# Patient Record
Sex: Female | Born: 1940 | Race: White | Hispanic: No | Marital: Married | Smoking: Never smoker
Health system: Southern US, Community
[De-identification: ages and names within clinical notes are randomized; demographics above are authoritative.]

## PROBLEM LIST (undated history)

## (undated) DIAGNOSIS — R091 Pleurisy: Secondary | ICD-10-CM

## (undated) DIAGNOSIS — Z91018 Allergy to other foods: Secondary | ICD-10-CM

## (undated) DIAGNOSIS — M810 Age-related osteoporosis without current pathological fracture: Secondary | ICD-10-CM

## (undated) DIAGNOSIS — C50912 Malignant neoplasm of unspecified site of left female breast: Secondary | ICD-10-CM

## (undated) DIAGNOSIS — E063 Autoimmune thyroiditis: Secondary | ICD-10-CM

## (undated) DIAGNOSIS — I8390 Asymptomatic varicose veins of unspecified lower extremity: Secondary | ICD-10-CM

## (undated) DIAGNOSIS — M1990 Unspecified osteoarthritis, unspecified site: Secondary | ICD-10-CM

## (undated) DIAGNOSIS — M419 Scoliosis, unspecified: Secondary | ICD-10-CM

## (undated) DIAGNOSIS — Z8489 Family history of other specified conditions: Secondary | ICD-10-CM

## (undated) HISTORY — DX: Age-related osteoporosis without current pathological fracture: M81.0

## (undated) HISTORY — DX: Malignant neoplasm of unspecified site of left female breast: C50.912

## (undated) HISTORY — PX: SIGMOIDOSCOPY: SUR1295

---
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
## 1998-05-09 ENCOUNTER — Ambulatory Visit (HOSPITAL_COMMUNITY): Admission: RE | Admit: 1998-05-09 | Discharge: 1998-05-09 | Payer: Self-pay | Admitting: Family Medicine

## 1998-06-15 ENCOUNTER — Ambulatory Visit (HOSPITAL_BASED_OUTPATIENT_CLINIC_OR_DEPARTMENT_OTHER): Admission: RE | Admit: 1998-06-15 | Discharge: 1998-06-15 | Payer: Self-pay | Admitting: Ophthalmology

## 2000-03-06 ENCOUNTER — Other Ambulatory Visit: Admission: RE | Admit: 2000-03-06 | Discharge: 2000-03-06 | Payer: Self-pay | Admitting: Family Medicine

## 2000-06-05 ENCOUNTER — Other Ambulatory Visit: Admission: RE | Admit: 2000-06-05 | Discharge: 2000-06-05 | Payer: Self-pay | Admitting: Family Medicine

## 2000-10-23 ENCOUNTER — Other Ambulatory Visit: Admission: RE | Admit: 2000-10-23 | Discharge: 2000-10-23 | Payer: Self-pay | Admitting: Family Medicine

## 2000-10-23 ENCOUNTER — Encounter: Payer: Self-pay | Admitting: Family Medicine

## 2000-10-23 ENCOUNTER — Encounter: Admission: RE | Admit: 2000-10-23 | Discharge: 2000-10-23 | Payer: Self-pay | Admitting: Family Medicine

## 2011-11-02 DIAGNOSIS — M9981 Other biomechanical lesions of cervical region: Secondary | ICD-10-CM | POA: Diagnosis not present

## 2011-11-02 DIAGNOSIS — M542 Cervicalgia: Secondary | ICD-10-CM | POA: Diagnosis not present

## 2011-11-14 DIAGNOSIS — M9981 Other biomechanical lesions of cervical region: Secondary | ICD-10-CM | POA: Diagnosis not present

## 2011-11-14 DIAGNOSIS — M542 Cervicalgia: Secondary | ICD-10-CM | POA: Diagnosis not present

## 2011-11-29 DIAGNOSIS — M9981 Other biomechanical lesions of cervical region: Secondary | ICD-10-CM | POA: Diagnosis not present

## 2011-11-29 DIAGNOSIS — M542 Cervicalgia: Secondary | ICD-10-CM | POA: Diagnosis not present

## 2011-12-05 DIAGNOSIS — M542 Cervicalgia: Secondary | ICD-10-CM | POA: Diagnosis not present

## 2011-12-05 DIAGNOSIS — M9981 Other biomechanical lesions of cervical region: Secondary | ICD-10-CM | POA: Diagnosis not present

## 2011-12-20 DIAGNOSIS — M9981 Other biomechanical lesions of cervical region: Secondary | ICD-10-CM | POA: Diagnosis not present

## 2011-12-20 DIAGNOSIS — M542 Cervicalgia: Secondary | ICD-10-CM | POA: Diagnosis not present

## 2011-12-28 DIAGNOSIS — M9981 Other biomechanical lesions of cervical region: Secondary | ICD-10-CM | POA: Diagnosis not present

## 2011-12-28 DIAGNOSIS — M542 Cervicalgia: Secondary | ICD-10-CM | POA: Diagnosis not present

## 2012-01-11 DIAGNOSIS — M542 Cervicalgia: Secondary | ICD-10-CM | POA: Diagnosis not present

## 2012-01-11 DIAGNOSIS — M9981 Other biomechanical lesions of cervical region: Secondary | ICD-10-CM | POA: Diagnosis not present

## 2012-01-24 DIAGNOSIS — M9981 Other biomechanical lesions of cervical region: Secondary | ICD-10-CM | POA: Diagnosis not present

## 2012-01-24 DIAGNOSIS — M542 Cervicalgia: Secondary | ICD-10-CM | POA: Diagnosis not present

## 2012-02-08 DIAGNOSIS — M9981 Other biomechanical lesions of cervical region: Secondary | ICD-10-CM | POA: Diagnosis not present

## 2012-02-08 DIAGNOSIS — M542 Cervicalgia: Secondary | ICD-10-CM | POA: Diagnosis not present

## 2012-02-18 DIAGNOSIS — M542 Cervicalgia: Secondary | ICD-10-CM | POA: Diagnosis not present

## 2012-02-18 DIAGNOSIS — M9981 Other biomechanical lesions of cervical region: Secondary | ICD-10-CM | POA: Diagnosis not present

## 2012-02-29 DIAGNOSIS — M9981 Other biomechanical lesions of cervical region: Secondary | ICD-10-CM | POA: Diagnosis not present

## 2012-02-29 DIAGNOSIS — M542 Cervicalgia: Secondary | ICD-10-CM | POA: Diagnosis not present

## 2012-03-20 DIAGNOSIS — M9981 Other biomechanical lesions of cervical region: Secondary | ICD-10-CM | POA: Diagnosis not present

## 2012-03-20 DIAGNOSIS — M542 Cervicalgia: Secondary | ICD-10-CM | POA: Diagnosis not present

## 2012-04-09 DIAGNOSIS — M9981 Other biomechanical lesions of cervical region: Secondary | ICD-10-CM | POA: Diagnosis not present

## 2012-04-09 DIAGNOSIS — M542 Cervicalgia: Secondary | ICD-10-CM | POA: Diagnosis not present

## 2012-04-23 DIAGNOSIS — M9981 Other biomechanical lesions of cervical region: Secondary | ICD-10-CM | POA: Diagnosis not present

## 2012-04-23 DIAGNOSIS — M542 Cervicalgia: Secondary | ICD-10-CM | POA: Diagnosis not present

## 2012-04-29 DIAGNOSIS — H2510 Age-related nuclear cataract, unspecified eye: Secondary | ICD-10-CM | POA: Diagnosis not present

## 2012-04-29 DIAGNOSIS — H5231 Anisometropia: Secondary | ICD-10-CM | POA: Diagnosis not present

## 2012-04-29 DIAGNOSIS — H52229 Regular astigmatism, unspecified eye: Secondary | ICD-10-CM | POA: Diagnosis not present

## 2012-04-29 DIAGNOSIS — H524 Presbyopia: Secondary | ICD-10-CM | POA: Diagnosis not present

## 2012-05-15 DIAGNOSIS — M9981 Other biomechanical lesions of cervical region: Secondary | ICD-10-CM | POA: Diagnosis not present

## 2012-05-15 DIAGNOSIS — M542 Cervicalgia: Secondary | ICD-10-CM | POA: Diagnosis not present

## 2012-05-28 DIAGNOSIS — M542 Cervicalgia: Secondary | ICD-10-CM | POA: Diagnosis not present

## 2012-05-28 DIAGNOSIS — M9981 Other biomechanical lesions of cervical region: Secondary | ICD-10-CM | POA: Diagnosis not present

## 2012-06-12 DIAGNOSIS — M542 Cervicalgia: Secondary | ICD-10-CM | POA: Diagnosis not present

## 2012-06-12 DIAGNOSIS — M9981 Other biomechanical lesions of cervical region: Secondary | ICD-10-CM | POA: Diagnosis not present

## 2012-06-16 DIAGNOSIS — R6889 Other general symptoms and signs: Secondary | ICD-10-CM | POA: Diagnosis not present

## 2012-06-16 DIAGNOSIS — Z13220 Encounter for screening for lipoid disorders: Secondary | ICD-10-CM | POA: Diagnosis not present

## 2012-06-16 DIAGNOSIS — Z0000 Encounter for general adult medical examination without abnormal findings: Secondary | ICD-10-CM | POA: Diagnosis not present

## 2012-06-16 DIAGNOSIS — E039 Hypothyroidism, unspecified: Secondary | ICD-10-CM | POA: Diagnosis not present

## 2012-06-18 DIAGNOSIS — Z0000 Encounter for general adult medical examination without abnormal findings: Secondary | ICD-10-CM | POA: Diagnosis not present

## 2012-06-18 DIAGNOSIS — E039 Hypothyroidism, unspecified: Secondary | ICD-10-CM | POA: Diagnosis not present

## 2012-06-19 DIAGNOSIS — Z1212 Encounter for screening for malignant neoplasm of rectum: Secondary | ICD-10-CM | POA: Diagnosis not present

## 2012-06-26 DIAGNOSIS — M9981 Other biomechanical lesions of cervical region: Secondary | ICD-10-CM | POA: Diagnosis not present

## 2012-06-26 DIAGNOSIS — M542 Cervicalgia: Secondary | ICD-10-CM | POA: Diagnosis not present

## 2012-07-01 DIAGNOSIS — M810 Age-related osteoporosis without current pathological fracture: Secondary | ICD-10-CM | POA: Diagnosis not present

## 2012-07-01 DIAGNOSIS — Z13820 Encounter for screening for osteoporosis: Secondary | ICD-10-CM | POA: Diagnosis not present

## 2012-07-01 DIAGNOSIS — N959 Unspecified menopausal and perimenopausal disorder: Secondary | ICD-10-CM | POA: Diagnosis not present

## 2012-07-02 DIAGNOSIS — M542 Cervicalgia: Secondary | ICD-10-CM | POA: Diagnosis not present

## 2012-07-02 DIAGNOSIS — M9981 Other biomechanical lesions of cervical region: Secondary | ICD-10-CM | POA: Diagnosis not present

## 2012-07-16 DIAGNOSIS — M9981 Other biomechanical lesions of cervical region: Secondary | ICD-10-CM | POA: Diagnosis not present

## 2012-07-16 DIAGNOSIS — M542 Cervicalgia: Secondary | ICD-10-CM | POA: Diagnosis not present

## 2012-08-01 DIAGNOSIS — M542 Cervicalgia: Secondary | ICD-10-CM | POA: Diagnosis not present

## 2012-08-01 DIAGNOSIS — M9981 Other biomechanical lesions of cervical region: Secondary | ICD-10-CM | POA: Diagnosis not present

## 2012-08-14 DIAGNOSIS — M9981 Other biomechanical lesions of cervical region: Secondary | ICD-10-CM | POA: Diagnosis not present

## 2012-08-14 DIAGNOSIS — M542 Cervicalgia: Secondary | ICD-10-CM | POA: Diagnosis not present

## 2012-08-29 DIAGNOSIS — M9981 Other biomechanical lesions of cervical region: Secondary | ICD-10-CM | POA: Diagnosis not present

## 2012-08-29 DIAGNOSIS — M542 Cervicalgia: Secondary | ICD-10-CM | POA: Diagnosis not present

## 2012-09-12 DIAGNOSIS — M542 Cervicalgia: Secondary | ICD-10-CM | POA: Diagnosis not present

## 2012-09-12 DIAGNOSIS — M9981 Other biomechanical lesions of cervical region: Secondary | ICD-10-CM | POA: Diagnosis not present

## 2012-09-23 DIAGNOSIS — M542 Cervicalgia: Secondary | ICD-10-CM | POA: Diagnosis not present

## 2012-09-23 DIAGNOSIS — M9981 Other biomechanical lesions of cervical region: Secondary | ICD-10-CM | POA: Diagnosis not present

## 2012-10-03 DIAGNOSIS — M542 Cervicalgia: Secondary | ICD-10-CM | POA: Diagnosis not present

## 2012-10-03 DIAGNOSIS — M9981 Other biomechanical lesions of cervical region: Secondary | ICD-10-CM | POA: Diagnosis not present

## 2012-10-21 DIAGNOSIS — M9981 Other biomechanical lesions of cervical region: Secondary | ICD-10-CM | POA: Diagnosis not present

## 2012-10-21 DIAGNOSIS — M542 Cervicalgia: Secondary | ICD-10-CM | POA: Diagnosis not present

## 2012-11-07 DIAGNOSIS — M9981 Other biomechanical lesions of cervical region: Secondary | ICD-10-CM | POA: Diagnosis not present

## 2012-11-07 DIAGNOSIS — M542 Cervicalgia: Secondary | ICD-10-CM | POA: Diagnosis not present

## 2012-11-24 DIAGNOSIS — M9981 Other biomechanical lesions of cervical region: Secondary | ICD-10-CM | POA: Diagnosis not present

## 2012-11-24 DIAGNOSIS — M542 Cervicalgia: Secondary | ICD-10-CM | POA: Diagnosis not present

## 2012-12-08 DIAGNOSIS — M542 Cervicalgia: Secondary | ICD-10-CM | POA: Diagnosis not present

## 2012-12-08 DIAGNOSIS — M9981 Other biomechanical lesions of cervical region: Secondary | ICD-10-CM | POA: Diagnosis not present

## 2012-12-09 DIAGNOSIS — R0602 Shortness of breath: Secondary | ICD-10-CM | POA: Diagnosis not present

## 2012-12-09 DIAGNOSIS — E039 Hypothyroidism, unspecified: Secondary | ICD-10-CM | POA: Diagnosis not present

## 2012-12-16 DIAGNOSIS — E279 Disorder of adrenal gland, unspecified: Secondary | ICD-10-CM | POA: Diagnosis not present

## 2012-12-16 DIAGNOSIS — R6812 Fussy infant (baby): Secondary | ICD-10-CM | POA: Diagnosis not present

## 2012-12-17 DIAGNOSIS — E039 Hypothyroidism, unspecified: Secondary | ICD-10-CM | POA: Diagnosis not present

## 2012-12-17 DIAGNOSIS — E559 Vitamin D deficiency, unspecified: Secondary | ICD-10-CM | POA: Diagnosis not present

## 2012-12-17 DIAGNOSIS — R7989 Other specified abnormal findings of blood chemistry: Secondary | ICD-10-CM | POA: Diagnosis not present

## 2012-12-17 DIAGNOSIS — D509 Iron deficiency anemia, unspecified: Secondary | ICD-10-CM | POA: Diagnosis not present

## 2012-12-17 DIAGNOSIS — R5381 Other malaise: Secondary | ICD-10-CM | POA: Diagnosis not present

## 2012-12-17 DIAGNOSIS — R0602 Shortness of breath: Secondary | ICD-10-CM | POA: Diagnosis not present

## 2012-12-19 DIAGNOSIS — M542 Cervicalgia: Secondary | ICD-10-CM | POA: Diagnosis not present

## 2012-12-19 DIAGNOSIS — M9981 Other biomechanical lesions of cervical region: Secondary | ICD-10-CM | POA: Diagnosis not present

## 2012-12-22 DIAGNOSIS — M542 Cervicalgia: Secondary | ICD-10-CM | POA: Diagnosis not present

## 2012-12-22 DIAGNOSIS — M9981 Other biomechanical lesions of cervical region: Secondary | ICD-10-CM | POA: Diagnosis not present

## 2013-01-09 DIAGNOSIS — M9981 Other biomechanical lesions of cervical region: Secondary | ICD-10-CM | POA: Diagnosis not present

## 2013-01-09 DIAGNOSIS — M542 Cervicalgia: Secondary | ICD-10-CM | POA: Diagnosis not present

## 2013-01-12 DIAGNOSIS — E039 Hypothyroidism, unspecified: Secondary | ICD-10-CM | POA: Diagnosis not present

## 2013-02-07 DIAGNOSIS — E039 Hypothyroidism, unspecified: Secondary | ICD-10-CM | POA: Diagnosis not present

## 2013-02-24 DIAGNOSIS — E039 Hypothyroidism, unspecified: Secondary | ICD-10-CM | POA: Diagnosis not present

## 2013-04-09 DIAGNOSIS — E039 Hypothyroidism, unspecified: Secondary | ICD-10-CM | POA: Diagnosis not present

## 2013-05-12 DIAGNOSIS — E559 Vitamin D deficiency, unspecified: Secondary | ICD-10-CM | POA: Diagnosis not present

## 2013-05-12 DIAGNOSIS — D509 Iron deficiency anemia, unspecified: Secondary | ICD-10-CM | POA: Diagnosis not present

## 2013-05-12 DIAGNOSIS — R7989 Other specified abnormal findings of blood chemistry: Secondary | ICD-10-CM | POA: Diagnosis not present

## 2013-05-12 DIAGNOSIS — E039 Hypothyroidism, unspecified: Secondary | ICD-10-CM | POA: Diagnosis not present

## 2013-05-12 DIAGNOSIS — R5383 Other fatigue: Secondary | ICD-10-CM | POA: Diagnosis not present

## 2013-05-19 DIAGNOSIS — E039 Hypothyroidism, unspecified: Secondary | ICD-10-CM | POA: Diagnosis not present

## 2013-06-04 DIAGNOSIS — L719 Rosacea, unspecified: Secondary | ICD-10-CM | POA: Diagnosis not present

## 2013-06-04 DIAGNOSIS — J309 Allergic rhinitis, unspecified: Secondary | ICD-10-CM | POA: Diagnosis not present

## 2013-06-04 DIAGNOSIS — T7840XA Allergy, unspecified, initial encounter: Secondary | ICD-10-CM | POA: Diagnosis not present

## 2013-06-04 DIAGNOSIS — E039 Hypothyroidism, unspecified: Secondary | ICD-10-CM | POA: Diagnosis not present

## 2013-06-30 DIAGNOSIS — E039 Hypothyroidism, unspecified: Secondary | ICD-10-CM | POA: Diagnosis not present

## 2013-09-03 DIAGNOSIS — Z6833 Body mass index (BMI) 33.0-33.9, adult: Secondary | ICD-10-CM | POA: Diagnosis not present

## 2013-09-03 DIAGNOSIS — E669 Obesity, unspecified: Secondary | ICD-10-CM | POA: Diagnosis not present

## 2013-09-03 DIAGNOSIS — Q828 Other specified congenital malformations of skin: Secondary | ICD-10-CM | POA: Diagnosis not present

## 2013-09-07 DIAGNOSIS — R5381 Other malaise: Secondary | ICD-10-CM | POA: Diagnosis not present

## 2013-09-07 DIAGNOSIS — R7989 Other specified abnormal findings of blood chemistry: Secondary | ICD-10-CM | POA: Diagnosis not present

## 2013-09-07 DIAGNOSIS — E039 Hypothyroidism, unspecified: Secondary | ICD-10-CM | POA: Diagnosis not present

## 2013-09-08 DIAGNOSIS — E039 Hypothyroidism, unspecified: Secondary | ICD-10-CM | POA: Diagnosis not present

## 2013-09-08 DIAGNOSIS — Z124 Encounter for screening for malignant neoplasm of cervix: Secondary | ICD-10-CM | POA: Diagnosis not present

## 2013-09-08 DIAGNOSIS — E663 Overweight: Secondary | ICD-10-CM | POA: Diagnosis not present

## 2013-09-08 DIAGNOSIS — Z1212 Encounter for screening for malignant neoplasm of rectum: Secondary | ICD-10-CM | POA: Diagnosis not present

## 2013-09-08 DIAGNOSIS — Z0000 Encounter for general adult medical examination without abnormal findings: Secondary | ICD-10-CM | POA: Diagnosis not present

## 2013-09-08 DIAGNOSIS — R87615 Unsatisfactory cytologic smear of cervix: Secondary | ICD-10-CM | POA: Diagnosis not present

## 2013-09-08 DIAGNOSIS — IMO0002 Reserved for concepts with insufficient information to code with codable children: Secondary | ICD-10-CM | POA: Diagnosis not present

## 2013-09-08 DIAGNOSIS — Z01419 Encounter for gynecological examination (general) (routine) without abnormal findings: Secondary | ICD-10-CM | POA: Diagnosis not present

## 2013-09-09 DIAGNOSIS — IMO0002 Reserved for concepts with insufficient information to code with codable children: Secondary | ICD-10-CM | POA: Diagnosis not present

## 2013-09-09 DIAGNOSIS — Z01419 Encounter for gynecological examination (general) (routine) without abnormal findings: Secondary | ICD-10-CM | POA: Diagnosis not present

## 2013-09-09 DIAGNOSIS — E039 Hypothyroidism, unspecified: Secondary | ICD-10-CM | POA: Diagnosis not present

## 2013-09-09 DIAGNOSIS — Z1212 Encounter for screening for malignant neoplasm of rectum: Secondary | ICD-10-CM | POA: Diagnosis not present

## 2013-09-09 DIAGNOSIS — E663 Overweight: Secondary | ICD-10-CM | POA: Diagnosis not present

## 2013-09-09 DIAGNOSIS — Z0000 Encounter for general adult medical examination without abnormal findings: Secondary | ICD-10-CM | POA: Diagnosis not present

## 2013-09-09 DIAGNOSIS — Z124 Encounter for screening for malignant neoplasm of cervix: Secondary | ICD-10-CM | POA: Diagnosis not present

## 2013-09-16 DIAGNOSIS — H2510 Age-related nuclear cataract, unspecified eye: Secondary | ICD-10-CM | POA: Diagnosis not present

## 2013-09-21 DIAGNOSIS — E039 Hypothyroidism, unspecified: Secondary | ICD-10-CM | POA: Diagnosis not present

## 2013-11-13 DIAGNOSIS — L719 Rosacea, unspecified: Secondary | ICD-10-CM | POA: Diagnosis not present

## 2013-11-13 DIAGNOSIS — L819 Disorder of pigmentation, unspecified: Secondary | ICD-10-CM | POA: Diagnosis not present

## 2013-11-13 DIAGNOSIS — L821 Other seborrheic keratosis: Secondary | ICD-10-CM | POA: Diagnosis not present

## 2013-11-23 DIAGNOSIS — E039 Hypothyroidism, unspecified: Secondary | ICD-10-CM | POA: Diagnosis not present

## 2013-12-28 DIAGNOSIS — M542 Cervicalgia: Secondary | ICD-10-CM | POA: Diagnosis not present

## 2013-12-28 DIAGNOSIS — M9981 Other biomechanical lesions of cervical region: Secondary | ICD-10-CM | POA: Diagnosis not present

## 2013-12-29 DIAGNOSIS — R5381 Other malaise: Secondary | ICD-10-CM | POA: Diagnosis not present

## 2013-12-29 DIAGNOSIS — R7989 Other specified abnormal findings of blood chemistry: Secondary | ICD-10-CM | POA: Diagnosis not present

## 2013-12-29 DIAGNOSIS — E039 Hypothyroidism, unspecified: Secondary | ICD-10-CM | POA: Diagnosis not present

## 2013-12-29 DIAGNOSIS — R5383 Other fatigue: Secondary | ICD-10-CM | POA: Diagnosis not present

## 2013-12-29 DIAGNOSIS — E559 Vitamin D deficiency, unspecified: Secondary | ICD-10-CM | POA: Diagnosis not present

## 2014-01-12 DIAGNOSIS — E039 Hypothyroidism, unspecified: Secondary | ICD-10-CM | POA: Diagnosis not present

## 2014-01-27 DIAGNOSIS — M9981 Other biomechanical lesions of cervical region: Secondary | ICD-10-CM | POA: Diagnosis not present

## 2014-01-27 DIAGNOSIS — M542 Cervicalgia: Secondary | ICD-10-CM | POA: Diagnosis not present

## 2014-02-08 DIAGNOSIS — M542 Cervicalgia: Secondary | ICD-10-CM | POA: Diagnosis not present

## 2014-02-08 DIAGNOSIS — M9981 Other biomechanical lesions of cervical region: Secondary | ICD-10-CM | POA: Diagnosis not present

## 2014-02-10 DIAGNOSIS — M9981 Other biomechanical lesions of cervical region: Secondary | ICD-10-CM | POA: Diagnosis not present

## 2014-02-10 DIAGNOSIS — M542 Cervicalgia: Secondary | ICD-10-CM | POA: Diagnosis not present

## 2014-02-25 DIAGNOSIS — M542 Cervicalgia: Secondary | ICD-10-CM | POA: Diagnosis not present

## 2014-02-25 DIAGNOSIS — M9981 Other biomechanical lesions of cervical region: Secondary | ICD-10-CM | POA: Diagnosis not present

## 2014-03-18 DIAGNOSIS — M542 Cervicalgia: Secondary | ICD-10-CM | POA: Diagnosis not present

## 2014-03-18 DIAGNOSIS — M9981 Other biomechanical lesions of cervical region: Secondary | ICD-10-CM | POA: Diagnosis not present

## 2014-04-08 DIAGNOSIS — M542 Cervicalgia: Secondary | ICD-10-CM | POA: Diagnosis not present

## 2014-04-08 DIAGNOSIS — M9981 Other biomechanical lesions of cervical region: Secondary | ICD-10-CM | POA: Diagnosis not present

## 2014-04-28 DIAGNOSIS — M542 Cervicalgia: Secondary | ICD-10-CM | POA: Diagnosis not present

## 2014-04-28 DIAGNOSIS — M9981 Other biomechanical lesions of cervical region: Secondary | ICD-10-CM | POA: Diagnosis not present

## 2014-05-19 DIAGNOSIS — M542 Cervicalgia: Secondary | ICD-10-CM | POA: Diagnosis not present

## 2014-05-19 DIAGNOSIS — M9981 Other biomechanical lesions of cervical region: Secondary | ICD-10-CM | POA: Diagnosis not present

## 2014-05-31 DIAGNOSIS — M9981 Other biomechanical lesions of cervical region: Secondary | ICD-10-CM | POA: Diagnosis not present

## 2014-05-31 DIAGNOSIS — M542 Cervicalgia: Secondary | ICD-10-CM | POA: Diagnosis not present

## 2014-06-24 DIAGNOSIS — M9981 Other biomechanical lesions of cervical region: Secondary | ICD-10-CM | POA: Diagnosis not present

## 2014-06-24 DIAGNOSIS — M542 Cervicalgia: Secondary | ICD-10-CM | POA: Diagnosis not present

## 2014-06-29 DIAGNOSIS — R7989 Other specified abnormal findings of blood chemistry: Secondary | ICD-10-CM | POA: Diagnosis not present

## 2014-06-29 DIAGNOSIS — E039 Hypothyroidism, unspecified: Secondary | ICD-10-CM | POA: Diagnosis not present

## 2014-06-29 DIAGNOSIS — E559 Vitamin D deficiency, unspecified: Secondary | ICD-10-CM | POA: Diagnosis not present

## 2014-07-06 DIAGNOSIS — M9981 Other biomechanical lesions of cervical region: Secondary | ICD-10-CM | POA: Diagnosis not present

## 2014-07-06 DIAGNOSIS — M542 Cervicalgia: Secondary | ICD-10-CM | POA: Diagnosis not present

## 2014-07-13 DIAGNOSIS — E039 Hypothyroidism, unspecified: Secondary | ICD-10-CM | POA: Diagnosis not present

## 2014-07-20 DIAGNOSIS — M542 Cervicalgia: Secondary | ICD-10-CM | POA: Diagnosis not present

## 2014-07-20 DIAGNOSIS — M9981 Other biomechanical lesions of cervical region: Secondary | ICD-10-CM | POA: Diagnosis not present

## 2014-07-29 DIAGNOSIS — M542 Cervicalgia: Secondary | ICD-10-CM | POA: Diagnosis not present

## 2014-07-29 DIAGNOSIS — M9901 Segmental and somatic dysfunction of cervical region: Secondary | ICD-10-CM | POA: Diagnosis not present

## 2014-09-09 DIAGNOSIS — M8580 Other specified disorders of bone density and structure, unspecified site: Secondary | ICD-10-CM | POA: Diagnosis not present

## 2014-09-09 DIAGNOSIS — Z0001 Encounter for general adult medical examination with abnormal findings: Secondary | ICD-10-CM | POA: Diagnosis not present

## 2014-09-09 DIAGNOSIS — Z136 Encounter for screening for cardiovascular disorders: Secondary | ICD-10-CM | POA: Diagnosis not present

## 2014-09-09 DIAGNOSIS — Z131 Encounter for screening for diabetes mellitus: Secondary | ICD-10-CM | POA: Diagnosis not present

## 2014-09-09 DIAGNOSIS — Z13220 Encounter for screening for lipoid disorders: Secondary | ICD-10-CM | POA: Diagnosis not present

## 2014-09-09 DIAGNOSIS — N63 Unspecified lump in breast: Secondary | ICD-10-CM | POA: Diagnosis not present

## 2014-09-09 DIAGNOSIS — Z1389 Encounter for screening for other disorder: Secondary | ICD-10-CM | POA: Diagnosis not present

## 2014-09-30 DIAGNOSIS — R7989 Other specified abnormal findings of blood chemistry: Secondary | ICD-10-CM | POA: Diagnosis not present

## 2014-09-30 DIAGNOSIS — E559 Vitamin D deficiency, unspecified: Secondary | ICD-10-CM | POA: Diagnosis not present

## 2014-09-30 DIAGNOSIS — E039 Hypothyroidism, unspecified: Secondary | ICD-10-CM | POA: Diagnosis not present

## 2014-09-30 DIAGNOSIS — C50919 Malignant neoplasm of unspecified site of unspecified female breast: Secondary | ICD-10-CM | POA: Diagnosis not present

## 2014-09-30 DIAGNOSIS — N6002 Solitary cyst of left breast: Secondary | ICD-10-CM | POA: Diagnosis not present

## 2014-10-06 DIAGNOSIS — M810 Age-related osteoporosis without current pathological fracture: Secondary | ICD-10-CM | POA: Diagnosis not present

## 2014-10-06 DIAGNOSIS — H2513 Age-related nuclear cataract, bilateral: Secondary | ICD-10-CM | POA: Diagnosis not present

## 2014-10-14 DIAGNOSIS — E039 Hypothyroidism, unspecified: Secondary | ICD-10-CM | POA: Diagnosis not present

## 2014-10-14 DIAGNOSIS — N6002 Solitary cyst of left breast: Secondary | ICD-10-CM | POA: Diagnosis not present

## 2014-11-04 DIAGNOSIS — M542 Cervicalgia: Secondary | ICD-10-CM | POA: Diagnosis not present

## 2014-11-04 DIAGNOSIS — M9901 Segmental and somatic dysfunction of cervical region: Secondary | ICD-10-CM | POA: Diagnosis not present

## 2014-11-16 DIAGNOSIS — M542 Cervicalgia: Secondary | ICD-10-CM | POA: Diagnosis not present

## 2014-11-16 DIAGNOSIS — M9901 Segmental and somatic dysfunction of cervical region: Secondary | ICD-10-CM | POA: Diagnosis not present

## 2014-12-09 DIAGNOSIS — M9901 Segmental and somatic dysfunction of cervical region: Secondary | ICD-10-CM | POA: Diagnosis not present

## 2014-12-09 DIAGNOSIS — M542 Cervicalgia: Secondary | ICD-10-CM | POA: Diagnosis not present

## 2014-12-20 DIAGNOSIS — N63 Unspecified lump in breast: Secondary | ICD-10-CM | POA: Diagnosis not present

## 2015-01-03 DIAGNOSIS — M542 Cervicalgia: Secondary | ICD-10-CM | POA: Diagnosis not present

## 2015-01-03 DIAGNOSIS — M9901 Segmental and somatic dysfunction of cervical region: Secondary | ICD-10-CM | POA: Diagnosis not present

## 2015-01-18 DIAGNOSIS — N63 Unspecified lump in breast: Secondary | ICD-10-CM | POA: Diagnosis not present

## 2015-01-20 DIAGNOSIS — M542 Cervicalgia: Secondary | ICD-10-CM | POA: Diagnosis not present

## 2015-01-20 DIAGNOSIS — M9901 Segmental and somatic dysfunction of cervical region: Secondary | ICD-10-CM | POA: Diagnosis not present

## 2015-01-27 DIAGNOSIS — M542 Cervicalgia: Secondary | ICD-10-CM | POA: Diagnosis not present

## 2015-01-27 DIAGNOSIS — M9901 Segmental and somatic dysfunction of cervical region: Secondary | ICD-10-CM | POA: Diagnosis not present

## 2015-02-15 DIAGNOSIS — M9901 Segmental and somatic dysfunction of cervical region: Secondary | ICD-10-CM | POA: Diagnosis not present

## 2015-02-15 DIAGNOSIS — M542 Cervicalgia: Secondary | ICD-10-CM | POA: Diagnosis not present

## 2015-02-22 DIAGNOSIS — E069 Thyroiditis, unspecified: Secondary | ICD-10-CM | POA: Diagnosis not present

## 2015-02-22 DIAGNOSIS — E559 Vitamin D deficiency, unspecified: Secondary | ICD-10-CM | POA: Diagnosis not present

## 2015-02-22 DIAGNOSIS — R7989 Other specified abnormal findings of blood chemistry: Secondary | ICD-10-CM | POA: Diagnosis not present

## 2015-02-22 DIAGNOSIS — E039 Hypothyroidism, unspecified: Secondary | ICD-10-CM | POA: Diagnosis not present

## 2015-02-27 DIAGNOSIS — C50912 Malignant neoplasm of unspecified site of left female breast: Secondary | ICD-10-CM

## 2015-02-27 HISTORY — DX: Malignant neoplasm of unspecified site of left female breast: C50.912

## 2015-03-03 DIAGNOSIS — M9901 Segmental and somatic dysfunction of cervical region: Secondary | ICD-10-CM | POA: Diagnosis not present

## 2015-03-03 DIAGNOSIS — M542 Cervicalgia: Secondary | ICD-10-CM | POA: Diagnosis not present

## 2015-03-17 DIAGNOSIS — M542 Cervicalgia: Secondary | ICD-10-CM | POA: Diagnosis not present

## 2015-03-17 DIAGNOSIS — M9901 Segmental and somatic dysfunction of cervical region: Secondary | ICD-10-CM | POA: Diagnosis not present

## 2015-03-17 DIAGNOSIS — C50112 Malignant neoplasm of central portion of left female breast: Secondary | ICD-10-CM | POA: Diagnosis not present

## 2015-03-18 ENCOUNTER — Other Ambulatory Visit: Payer: Self-pay | Admitting: Surgery

## 2015-03-18 ENCOUNTER — Ambulatory Visit
Admission: RE | Admit: 2015-03-18 | Discharge: 2015-03-18 | Disposition: A | Discharge status: Home / Self Care | Payer: Medicare Other | Source: Ambulatory Visit | Attending: Surgery | Admitting: Surgery

## 2015-03-18 DIAGNOSIS — C50912 Malignant neoplasm of unspecified site of left female breast: Secondary | ICD-10-CM

## 2015-03-18 DIAGNOSIS — R590 Localized enlarged lymph nodes: Secondary | ICD-10-CM | POA: Diagnosis not present

## 2015-03-18 DIAGNOSIS — N63 Unspecified lump in breast: Secondary | ICD-10-CM | POA: Diagnosis not present

## 2015-03-18 DIAGNOSIS — C50812 Malignant neoplasm of overlapping sites of left female breast: Secondary | ICD-10-CM | POA: Diagnosis not present

## 2015-03-30 HISTORY — PX: BREAST BIOPSY: SHX20

## 2015-03-31 DIAGNOSIS — C50112 Malignant neoplasm of central portion of left female breast: Secondary | ICD-10-CM | POA: Diagnosis not present

## 2015-03-31 DIAGNOSIS — Z803 Family history of malignant neoplasm of breast: Secondary | ICD-10-CM | POA: Diagnosis not present

## 2015-04-04 DIAGNOSIS — C50112 Malignant neoplasm of central portion of left female breast: Secondary | ICD-10-CM | POA: Diagnosis not present

## 2015-04-05 DIAGNOSIS — M542 Cervicalgia: Secondary | ICD-10-CM | POA: Diagnosis not present

## 2015-04-05 DIAGNOSIS — M9901 Segmental and somatic dysfunction of cervical region: Secondary | ICD-10-CM | POA: Diagnosis not present

## 2015-04-07 ENCOUNTER — Other Ambulatory Visit: Payer: Self-pay | Admitting: *Deleted

## 2015-04-07 DIAGNOSIS — C50912 Malignant neoplasm of unspecified site of left female breast: Secondary | ICD-10-CM

## 2015-04-07 DIAGNOSIS — C50919 Malignant neoplasm of unspecified site of unspecified female breast: Secondary | ICD-10-CM

## 2015-04-08 ENCOUNTER — Telehealth: Payer: Self-pay | Admitting: Hematology

## 2015-04-08 ENCOUNTER — Other Ambulatory Visit: Payer: Self-pay | Admitting: *Deleted

## 2015-04-08 ENCOUNTER — Other Ambulatory Visit: Payer: Self-pay | Admitting: General Surgery

## 2015-04-08 DIAGNOSIS — Z803 Family history of malignant neoplasm of breast: Secondary | ICD-10-CM | POA: Diagnosis not present

## 2015-04-08 DIAGNOSIS — C50912 Malignant neoplasm of unspecified site of left female breast: Secondary | ICD-10-CM

## 2015-04-08 DIAGNOSIS — C50112 Malignant neoplasm of central portion of left female breast: Secondary | ICD-10-CM | POA: Diagnosis not present

## 2015-04-08 NOTE — Telephone Encounter (Signed)
genetic appt-s/w Anderson Malta @ [facility] and gave genetic appt for 06/14 @ 1.

## 2015-04-11 ENCOUNTER — Telehealth: Payer: Self-pay | Admitting: *Deleted

## 2015-04-11 DIAGNOSIS — C50112 Malignant neoplasm of central portion of left female breast: Secondary | ICD-10-CM

## 2015-04-11 NOTE — Telephone Encounter (Signed)
Spoke with patient and scheduled and confirmed appointment for genetics and Dr. Jana Hakim for 04/12/15 at 3pm genetics and 430 with Dr. Jana Hakim.

## 2015-04-12 ENCOUNTER — Other Ambulatory Visit: Payer: Medicare Other

## 2015-04-12 ENCOUNTER — Other Ambulatory Visit (HOSPITAL_BASED_OUTPATIENT_CLINIC_OR_DEPARTMENT_OTHER): Payer: Medicare Other

## 2015-04-12 ENCOUNTER — Ambulatory Visit (HOSPITAL_BASED_OUTPATIENT_CLINIC_OR_DEPARTMENT_OTHER): Payer: Medicare Other | Admitting: Oncology

## 2015-04-12 ENCOUNTER — Ambulatory Visit: Payer: Medicare Other

## 2015-04-12 ENCOUNTER — Encounter: Payer: Self-pay | Admitting: Oncology

## 2015-04-12 ENCOUNTER — Ambulatory Visit: Payer: Medicare Other | Admitting: Genetic Counselor

## 2015-04-12 ENCOUNTER — Encounter: Payer: Self-pay | Admitting: Genetic Counselor

## 2015-04-12 VITALS — BP 115/61 | HR 83 | Temp 98.0°F | Resp 18 | Ht 62.0 in | Wt 121.0 lb

## 2015-04-12 DIAGNOSIS — Z170 Estrogen receptor positive status [ER+]: Secondary | ICD-10-CM | POA: Diagnosis not present

## 2015-04-12 DIAGNOSIS — Z806 Family history of leukemia: Secondary | ICD-10-CM | POA: Diagnosis not present

## 2015-04-12 DIAGNOSIS — C50112 Malignant neoplasm of central portion of left female breast: Secondary | ICD-10-CM

## 2015-04-12 DIAGNOSIS — Z803 Family history of malignant neoplasm of breast: Secondary | ICD-10-CM | POA: Diagnosis not present

## 2015-04-12 DIAGNOSIS — Z808 Family history of malignant neoplasm of other organs or systems: Secondary | ICD-10-CM

## 2015-04-12 DIAGNOSIS — Z8049 Family history of malignant neoplasm of other genital organs: Secondary | ICD-10-CM | POA: Diagnosis not present

## 2015-04-12 DIAGNOSIS — C50912 Malignant neoplasm of unspecified site of left female breast: Secondary | ICD-10-CM

## 2015-04-12 DIAGNOSIS — M810 Age-related osteoporosis without current pathological fracture: Secondary | ICD-10-CM | POA: Diagnosis not present

## 2015-04-12 DIAGNOSIS — C50812 Malignant neoplasm of overlapping sites of left female breast: Secondary | ICD-10-CM

## 2015-04-12 DIAGNOSIS — Z8052 Family history of malignant neoplasm of bladder: Secondary | ICD-10-CM

## 2015-04-12 LAB — CBC WITH DIFFERENTIAL/PLATELET
BASO%: 0.9 % (ref 0.0–2.0)
BASOS ABS: 0 10*3/uL (ref 0.0–0.1)
EOS%: 2.2 % (ref 0.0–7.0)
Eosinophils Absolute: 0.1 10*3/uL (ref 0.0–0.5)
HCT: 43.4 % (ref 34.8–46.6)
HGB: 14.4 g/dL (ref 11.6–15.9)
LYMPH#: 1.2 10*3/uL (ref 0.9–3.3)
LYMPH%: 24.5 % (ref 14.0–49.7)
MCH: 30.4 pg (ref 25.1–34.0)
MCHC: 33.1 g/dL (ref 31.5–36.0)
MCV: 91.9 fL (ref 79.5–101.0)
MONO#: 0.5 10*3/uL (ref 0.1–0.9)
MONO%: 11.3 % (ref 0.0–14.0)
NEUT#: 2.9 10*3/uL (ref 1.5–6.5)
NEUT%: 61.1 % (ref 38.4–76.8)
PLATELETS: 203 10*3/uL (ref 145–400)
RBC: 4.73 10*6/uL (ref 3.70–5.45)
RDW: 13.3 % (ref 11.2–14.5)
WBC: 4.8 10*3/uL (ref 3.9–10.3)

## 2015-04-12 LAB — COMPREHENSIVE METABOLIC PANEL (CC13)
ALT: 33 U/L (ref 0–55)
AST: 41 U/L — AB (ref 5–34)
Albumin: 4 g/dL (ref 3.5–5.0)
Alkaline Phosphatase: 100 U/L (ref 40–150)
Anion Gap: 12 mEq/L — ABNORMAL HIGH (ref 3–11)
BILIRUBIN TOTAL: 0.43 mg/dL (ref 0.20–1.20)
BUN: 13.8 mg/dL (ref 7.0–26.0)
CO2: 26 mEq/L (ref 22–29)
Calcium: 9.7 mg/dL (ref 8.4–10.4)
Chloride: 102 mEq/L (ref 98–109)
Creatinine: 0.8 mg/dL (ref 0.6–1.1)
EGFR: 72 mL/min/{1.73_m2} — ABNORMAL LOW (ref >90)
Glucose: 95 mg/dl (ref 70–140)
Potassium: 4.1 mEq/L (ref 3.5–5.1)
SODIUM: 141 meq/L (ref 136–145)
TOTAL PROTEIN: 7.5 g/dL (ref 6.4–8.3)

## 2015-04-12 NOTE — Progress Notes (Signed)
REFERRING PROVIDER: Fanny Skates, MD [place], [place] [address]  Lurline Del, MD  PRIMARY PROVIDER:  Mingo Amber, MD   PRIMARY REASON FOR VISIT:  1. Breast cancer, left   2. Family history of breast cancer in mother   55. Family history of leukemia   4. Family history of skin cancer   5. Family history of breast cancer in female   28. Family history of bladder cancer   7. Family history of cancer of female genital organ      HISTORY OF PRESENT ILLNESS:   Megan Case, a 74 y.o. female, was seen for a [place] cancer genetics consultation at the request of Dr. Dalbert Batman due to a personal history of breat cancer and family history of breat and other cancers.  [name] presents to clinic today with her husband to discuss the possibility of a hereditary predisposition to breast cancer, genetic testing, and to further clarify her future cancer risks, as well as potential cancer risks for family members.   In May 2016, at the age of 26, Ms. Megan Case was diagnosed with invasive ductal carcinoma of the left breast. The hormone receptor status of this breast cancer was ER+, PR-, and Her2-.  Ms. Megan Case currently reports plans to treat her cancer with left mastectomy, scheduled for 04/19/15, and homeopathic treatment.  She is interested in genetic counseling and potential genetic testing for the benefit of her children.  CANCER HISTORY:   No history exists.  June 2016 - Invasive ductal carcinoma of left breast; ER+, PR-, Her2-   HORMONAL RISK FACTORS:  Menarche was at age 64-10.  First live birth at age 78.  OCP use for approximately >10  years.  Ovaries intact: yes.  Hysterectomy: no.  Menopausal status: postmenopausal.  HRT use: 14 years of estrogen, then combination estrogen/progesterone Colonoscopy: no, a previous sigmoidoscopy was normal; not examined. Mammogram within the last year: no. Number of breast biopsies: 1. Up to date with pelvic exams:  n/a. Any  excessive radiation exposure in the past:  Perhaps as a child when x-rays were utilized more liberally  Past Medical History  Diagnosis Date  . Breast cancer, left breast   . Osteoporosis   . Breast cancer 02/2015    L breast    History reviewed. No pertinent past surgical history.  History   Social History  . Marital Status: Married    Spouse Name: N/A  . Number of Children: N/A  . Years of Education: N/A   Social History Main Topics  . Smoking status: Never Smoker   . Smokeless tobacco: Never Used  . Alcohol Use: No  . Drug Use: Not on file  . Sexual Activity: Not on file   Other Topics Concern  . None   Social History Narrative     FAMILY HISTORY:  We obtained a detailed, 4-generation family history.  Significant diagnoses are listed below: Family History  Problem Relation Age of Onset  . Breast cancer Mother 68  . Leukemia Brother   . Breast cancer Maternal Aunt   . Diabetes Maternal Uncle   . Heart Problems Maternal Uncle   . Cancer Maternal Grandmother 24    "female cancer"  . Diabetes Brother   . Heart attack Brother   . Bladder Cancer Cousin 29    Ms. Megan Case has no information regarding her paternal family history because her father was adopted.  Her father was diagnosed with skin cancer, but worked as a Psychologist, sport and exercise  so was exposed to the sun often.  Ms. Megan Case has a maternal family history of breast and other cancers.  Her mother was diagnosed with breast cancer at 33.  Her maternal aunt was diagnosed with breast cancer at 22.  The son of this aunt was diagnosed with bladder cancer at 53.  Ms. Megan Case had three maternal uncles, all of whom were cancer-free into their 77s.  Her maternal grandmother was diagnosed with a "female cancer" at the age of 53.  Two more distant cousins through Ms. Lampe great aunt were diagnosed with breast cancer--one in her 39s.   Ms. Megan Case herself has a son, 50, and a daughter, 57, both of whom are cancer-free.  She has six grandchildren who  are also cancer-free.    Ms. Megan Case maternal ancestors are of [place] descent, and paternal ancestors are of unknown descent because her father was adopted and she has no information regarding his family. There is no reported Ashkenazi Jewish ancestry. There is no known consanguinity.  GENETIC COUNSELING ASSESSMENT: Megan Case is a 74 y.o. female with a personal and family history of cancer which somewhat suggestive of a hereditary breast cancer syndrome and predisposition to cancer. We, therefore, discussed and recommended the following at today's visit.   DISCUSSION: We reviewed the characteristics, features and inheritance patterns of hereditary cancer syndromes. We also discussed genetic testing, including the appropriate family members to test, the process of testing, insurance coverage and turn-around-time for results. We discussed the implications of a negative, positive and/or variant of uncertain significant result. We recommended Megan Case pursue genetic testing for the 24-gene OvaNext panel [facility] [place]   Based on Megan Case's personal and family history of cancer, she meets medical criteria for genetic testing. Despite that she meets criteria, she may still have an out of pocket cost. We discussed that if her out of pocket cost for testing is over $100, the laboratory will call and confirm whether she wants to proceed with testing.  If the out of pocket cost of testing is less than $100 she will be billed by the genetic testing laboratory.    PLAN: After considering the risks, benefits, and limitations, Megan Case  provided informed consent to pursue genetic testing and the blood sample was sent to [facility] for analysis of the OvaNext panel test.  The OvaNext panel test includes sequencing and deletion/duplication analysis of 23 genes including: ATM, BARD1, BRCA1, BRCA2, BRIP1, CDH1, CHEK2, MLH1, MRE11A, MSH2, MSH6, MUTYH, NBN, NF1,  PALB2, PMS2, PTEN, RAD50, RAD51C, RAD51D, SMARCA4, STK11, and TP53.  Additionally, deletion/duplication analysis (without next-generation sequencing) is assessed for EPCAM.   Results should be available within approximately 3 weeks' time, at which point they will be disclosed by telephone to Ms. Courter, as will any additional recommendations warranted by these results. Ms. Fotheringham will receive a summary of her genetic counseling visit and a copy of her results once available. This information will also be available in Epic. We encouraged Ms. Hoke to remain in contact with cancer genetics annually so that we can continuously update the family history and inform her of any changes in cancer genetics and testing that may be of benefit for her family. Ms. Steuck questions were answered to her satisfaction today. Our contact information was provided should additional questions or concerns arise.  Thank you for the referral and allowing [place] to share in the care of your patient.   Jeanine Luz, MS Genetic Counselor [email or web address]  phone: [phone]  The patient was seen for a total of 60 minutes in face-to-face genetic counseling.  This patient was discussed with Drs. Magrinat, Lindi Adie and/or Burr Medico who agrees with the above.    _______________________________________________________________________ For Office Staff:  Number of people involved in session: 2 Was an Intern/ student involved with case: no

## 2015-04-12 NOTE — Progress Notes (Signed)
[place]  Telephone:([phone]) [phone] Fax:(336) [phone]     ID: ARILYN BRIERLEY DOB: [birth year]  MR#: [id number]  XIH#:[id number]  No care team member to display PCP: Mingo Amber MD GYN: SU: Fanny Skates MD OTHER MD: Earle Gell MD  CHIEF COMPLAINT: Stage II breast cancer, weakly estrogen receptor positive  CURRENT TREATMENT: Awaiting definitive surgery   BREAST CANCER HISTORY: Phillippa tells me she has a long history of problems in her breasts, with multiple aspirations and other procedures, for what always turned out to be benign disease. She did notice some changes in her left breast but then in 2023-04-03 her husband "died" with V. fib. He was successfully resuscitated and underwent CABG, but with all that going on she really did not pay much attention to her left breast changes.  She did eventually bring it to the attention of Dr. Jaynie Collins and on 03/18/2015 and underwent bilateral diagnostic mammography with tomosynthesis and left breast ultrasonography. On mammography there was a large lobulated mass in the upper outer quadrant of the left breast associated with architectural distortion. The rest of the breasts, which was density category B, showed some generalized thickening of the overlying skin but no other suspicious lesions and no suspicious calcifications. Targeted ultrasound showed a large solid heterogeneous mass in the area in question measuring 3.9 cm. In the left axilla was a single prominent lymph node with a thickened cortex measuring 4 mm. The node itself measured 9 mm.  On 03/18/2015 (the same day) the patient underwent biopsy of the breast mass and lymph node in question. 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  Her case was presented at  the multidisciplinary breast cancer conference 03/30/2015. At that time it was felt that the patient's would benefit from genetics and neoadjuvant therapy. She will need staging studies and MRI was also recommended.  Her subsequent history is as detailed below  INTERVAL HISTORY: Angeleena was evaluated in the breast clinic 04/12/2015 accompanied by her husband Gene  REVIEW OF SYSTEMS: Aside from the mass itself she is doing "fine". She has lost a little bit of weight, has some chronic back pain and arthritis pain secondary to scoliosis, which is not more persistent or intense than before. Otherwise a detailed review of systems today was noncontributory  PAST MEDICAL HISTORY: Past Medical History  Diagnosis Date  . Breast cancer, left breast   . Osteoporosis     PAST SURGICAL HISTORY: No past surgical history on file.  FAMILY HISTORY No family history on file. The patient's father died at the age of 56 from a stroke. The patient's mother was diagnosed with breast cancer in her early 72s. She died at age 32 from unrelated causes. The patient's mother had one sister, diagnosed at age 64 with breast cancer. The patient herself had 2 brothers, no sisters. She does have a cousin on her mother's side diagnosed with breast cancer in her 17s. There is no history of ovarian cancer in the family.  GYNECOLOGIC HISTORY:  No LMP recorded. Menarche age 79, first live birth age 65. The patient is GX P2. She went through the change of life approximately 1995. She took hormone replacement for at least 5 years. She also took oral contraceptives for approximately 14 years remotely, with no complications.  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 Her husband Cornelia Copa "[employer] used to work for [employer]. Their children are Joneen Caraway, who lives in [place] and is undergone of course superintendent, and Iona Beard lives in [place] and runs an office for a [employer]. The patient has 6 grandchildren. She attends a Patent attorney    ADVANCED DIRECTIVES: In place. The patient's husband and her daughter Harmon Pier our joint has Our as of attorney. Harmon Pier can be reached at [phone]   HEALTH MAINTENANCE: History  Substance Use Topics  . Smoking status: Not on file  . Smokeless tobacco: Not on file  . Alcohol Use: Not on file     Colonoscopy: Never  PAP:  Bone density: 2015/osteoporosis  Lipid panel:  Allergies  Allergen Reactions  . Penicillins Swelling  . Codeine Other (See Comments)    Pounding headache    Current Outpatient Prescriptions  Medication Sig Dispense Refill  . NATURE-THROID 65 MG tablet   1   No current facility-administered medications for this visit.    OBJECTIVE: Older white woman in no acute distress Filed Vitals:   04/12/15 1622  BP: 115/61  Pulse: 83  Temp: 98 F (36.7 C)  Resp: 18     Body mass index is 22.13 kg/(m^2).    ECOG FS:1 - Symptomatic but completely ambulatory  Ocular: Sclerae unicteric, pupils equal, round and reactive to light Ear-nose-throat: Oropharynx clear and moist Lymphatic: No cervical or supraclavicular adenopathy Lungs no rales or rhonchi, good excursion bilaterally Heart regular rate and rhythm, no murmur appreciated Abd soft, nontender, positive bowel sounds MSK no focal spinal tenderness, no joint edema Neuro: non-focal, well-oriented, appropriate affect Breasts: The right breast is unremarkable. In the left breast there is a mass that by palpation measures approximately 4-5 cm and is easily movable. There is a single flat 1 cm lymph node palpable in the left axilla. It is not fixed. While there is a slight blush over the left breast mass focally, this is not an inflammatory breast cancer at this point. The left breast is imaged below   04/12/2015, left breast     LAB RESULTS:  CMP     Component Value  Date/Time   NA 141 04/12/2015 1616   K 4.1 04/12/2015 1616   CO2 26 04/12/2015 1616   GLUCOSE 95 04/12/2015 1616   BUN 13.8 04/12/2015 1616   CREATININE 0.8 04/12/2015 1616   CALCIUM 9.7 04/12/2015 1616   PROT 7.5 04/12/2015 1616   ALBUMIN 4.0 04/12/2015 1616   AST 41* 04/12/2015 1616   ALT 33 04/12/2015 1616   ALKPHOS 100 04/12/2015 1616   BILITOT 0.43 04/12/2015 1616    INo results found for: SPEP, UPEP  Lab Results  Component Value Date   WBC 4.8 04/12/2015   NEUTROABS 2.9 04/12/2015   HGB 14.4 04/12/2015   HCT 43.4 04/12/2015   MCV 91.9 04/12/2015   PLT 203 04/12/2015      Chemistry      Component Value Date/Time   NA 141 04/12/2015 1616   K 4.1 04/12/2015 1616   CO2 26 04/12/2015 1616   BUN 13.8 04/12/2015 1616   CREATININE 0.8 04/12/2015 1616      Component Value Date/Time   CALCIUM 9.7 04/12/2015 1616   ALKPHOS 100 04/12/2015 1616   AST 41* 04/12/2015 1616   ALT 33 04/12/2015 1616   BILITOT 0.43  04/12/2015 1616       No results found for: LABCA2  No components found for: QMGNO037  No results for input(s): INR in the last 168 hours.  Urinalysis No results found for: COLORURINE, APPEARANCEUR, LABSPEC, PHURINE, GLUCOSEU, HGBUR, BILIRUBINUR, KETONESUR, PROTEINUR, UROBILINOGEN, NITRITE, LEUKOCYTESUR  STUDIES: Mm Digital Diagnostic Unilat L  03/18/2015   CLINICAL DATA:  Post clip mammograms following ultrasound-guided core needle biopsy of a large left breast mass.  EXAM: DIAGNOSTIC LEFT MAMMOGRAM POST ULTRASOUND BIOPSY  COMPARISON:  Previous exam(s).  FINDINGS: Mammographic images were obtained following ultrasound guided biopsy of a large left breast mass. The ribbon shaped biopsy clip lies within central aspect of the mass.  IMPRESSION: Well-positioned ribbon shaped biopsy clip following ultrasound-guided core needle biopsy of a left breast mass.  Final Assessment: Post Procedure Mammograms for Marker Placement   Electronically Signed   By: Lajean Manes  M.D.   On: 03/18/2015 17:00   US Breast Ltd Uni Left Inc Axilla  03/18/2015   CLINICAL DATA:  Patient presents with a large left breast mass bulging the overlying skin.  EXAM: DIGITAL DIAGNOSTIC BILATERAL MAMMOGRAM WITH 3D TOMOSYNTHESIS WITH CAD  ULTRASOUND LEFT BREAST  COMPARISON:  None.  ACR Breast Density Category b: There are scattered areas of fibroglandular density.  FINDINGS: There is a large lobulated mass in the 12 o'clock position of the left breast, mid depth. No other breast masses. There is some architectural distortion associated with the large mass on the left. No other distortion. There is generalized thickening of the suspensory ligaments on the left as well as overlying skin thickening. No abnormality on the right. There are no suspicious calcifications.  Mammographic images were processed with CAD.  On physical exam, there is a large firm mass bulging the skin of the upper left breast.  Targeted ultrasound is performed, showing a large solid heterogeneous mass with significant internal blood flow and lobulated partly indistinct margins in the 12 o'clock position of the left breast, 3 cm the nipple. It measures 3.9 cm x 3 cm x 3.3 cm in size.  In the left axilla there is a single prominent lymph node with a thickened cortex measuring 4 mm. Node itself is sub cm in short axis measuring 9 mm. No other prominent or abnormal lymph nodes.  IMPRESSION: Highly suspicious mass in the left breast for a breast malignancy. Possible left axillary metastatic adenopathy to a single visualized node. Biopsy is recommended.  RECOMMENDATION: Biopsy of both the left breast mass and the single abnormal lymph node in the left axilla.  I have discussed the findings and recommendations with the patient. Results were also provided in writing at the conclusion of the visit. If applicable, a reminder letter will be sent to the patient regarding the next appointment.  BI-RADS CATEGORY  5: Highly suggestive of malignancy.    Electronically Signed   By: Lajean Manes M.D.   On: 03/18/2015 15:46   Mm Diag Breast Tomo Bilateral  03/18/2015   CLINICAL DATA:  Patient presents with a large left breast mass bulging the overlying skin.  EXAM: DIGITAL DIAGNOSTIC BILATERAL MAMMOGRAM WITH 3D TOMOSYNTHESIS WITH CAD  ULTRASOUND LEFT BREAST  COMPARISON:  None.  ACR Breast Density Category b: There are scattered areas of fibroglandular density.  FINDINGS: There is a large lobulated mass in the 12 o'clock position of the left breast, mid depth. No other breast masses. There is some architectural distortion associated with the large mass on the left. No other distortion.  There is generalized thickening of the suspensory ligaments on the left as well as overlying skin thickening. No abnormality on the right. There are no suspicious calcifications.  Mammographic images were processed with CAD.  On physical exam, there is a large firm mass bulging the skin of the upper left breast.  Targeted ultrasound is performed, showing a large solid heterogeneous mass with significant internal blood flow and lobulated partly indistinct margins in the 12 o'clock position of the left breast, 3 cm the nipple. It measures 3.9 cm x 3 cm x 3.3 cm in size.  In the left axilla there is a single prominent lymph node with a thickened cortex measuring 4 mm. Node itself is sub cm in short axis measuring 9 mm. No other prominent or abnormal lymph nodes.  IMPRESSION: Highly suspicious mass in the left breast for a breast malignancy. Possible left axillary metastatic adenopathy to a single visualized node. Biopsy is recommended.  RECOMMENDATION: Biopsy of both the left breast mass and the single abnormal lymph node in the left axilla.  I have discussed the findings and recommendations with the patient. Results were also provided in writing at the conclusion of the visit. If applicable, a reminder letter will be sent to the patient regarding the next appointment.  BI-RADS  CATEGORY  5: Highly suggestive of malignancy.   Electronically Signed   By: Lajean Manes M.D.   On: 03/18/2015 15:46   [place] Lt Breast Bx W Loc Dev 1st Lesion Img Bx Spec US Guide  03/22/2015   ADDENDUM REPORT: 03/22/2015 08:13  ADDENDUM: Pathology revealed grade III invasive ductal carcinoma in the left breast and a benign partially sampled left axillary lymph node. This was found to be concordant by Dr. Lajean Manes. Pathology was discussed with the patient by telephone. She reported doing well after the biopsy with tenderness and some initial fullness under the left axilla. Post biopsy instructions and care were reviewed and her questions were answered. Surgical consultation has been scheduled with Dr. Fanny Skates at [facility] on March 31, 2015. The patient was encouraged to come to [facility] for educational materials. My number was provided for future questions and concerns.  Pathology results reported by Susa Raring RN, BSN on Mar 22, 2015.   Electronically Signed   By: Lajean Manes M.D.   On: 03/22/2015 08:13   03/22/2015   CLINICAL DATA:  Patient presents for ultrasound-guided core needle biopsy of a large left breast mass and a prominent left axillary lymph node.  EXAM: ULTRASOUND GUIDED LEFT BREAST CORE NEEDLE BIOPSY  ULTRASOUND-GUIDED LEFT AXILLARY LYMPH NODE CORE NEEDLE BIOPSY  COMPARISON:  Previous exam(s).  FINDINGS: I met with the patient and we discussed the procedure of ultrasound-guided biopsy, including benefits and alternatives. We discussed the high likelihood of a successful procedure. We discussed the risks of the procedure, including infection, bleeding, tissue injury, clip migration, and inadequate sampling. Informed written consent was given. The usual time-out protocol was performed immediately prior to the procedure.  Using sterile technique and 2% Lidocaine as local anesthetic, under direct ultrasound visualization, a 12 gauge  spring-loaded device was used to perform biopsy of a large left breast mass using a medial approach. At the conclusion of the procedure a ribbon shaped tissue marker clip was deployed into the biopsy cavity. Follow up 2 view mammogram was performed and dictated separately.  Using sterile technique and 2% Lidocaine as local anesthetic, under direct ultrasound visualization, a 14  gauge spring-loaded device was used to perform biopsy of a prominent left axillary lymph node using an inferior approach. At the conclusion of the procedure a HydroMARK tissue marker clip was deployed into the biopsy cavity.  IMPRESSION: Ultrasound guided biopsy of a left breast mass and a prominent left axillary lymph node. No apparent complications.  Electronically Signed: By: Lajean Manes M.D. On: 03/18/2015 16:59   [place] Lt Breast Bx W Loc Dev Ea Add Lesion Img Bx Spec US Guide  03/22/2015   ADDENDUM REPORT: 03/22/2015 08:13  ADDENDUM: Pathology revealed grade III invasive ductal carcinoma in the left breast and a benign partially sampled left axillary lymph node. This was found to be concordant by Dr. Lajean Manes. Pathology was discussed with the patient by telephone. She reported doing well after the biopsy with tenderness and some initial fullness under the left axilla. Post biopsy instructions and care were reviewed and her questions were answered. Surgical consultation has been scheduled with Dr. Fanny Skates at [facility] on March 31, 2015. The patient was encouraged to come to [facility] for educational materials. My number was provided for future questions and concerns.  Pathology results reported by Susa Raring RN, BSN on Mar 22, 2015.   Electronically Signed   By: Lajean Manes M.D.   On: 03/22/2015 08:13   03/22/2015   CLINICAL DATA:  Patient presents for ultrasound-guided core needle biopsy of a large left breast mass and a prominent left axillary lymph node.  EXAM:  ULTRASOUND GUIDED LEFT BREAST CORE NEEDLE BIOPSY  ULTRASOUND-GUIDED LEFT AXILLARY LYMPH NODE CORE NEEDLE BIOPSY  COMPARISON:  Previous exam(s).  FINDINGS: I met with the patient and we discussed the procedure of ultrasound-guided biopsy, including benefits and alternatives. We discussed the high likelihood of a successful procedure. We discussed the risks of the procedure, including infection, bleeding, tissue injury, clip migration, and inadequate sampling. Informed written consent was given. The usual time-out protocol was performed immediately prior to the procedure.  Using sterile technique and 2% Lidocaine as local anesthetic, under direct ultrasound visualization, a 12 gauge spring-loaded device was used to perform biopsy of a large left breast mass using a medial approach. At the conclusion of the procedure a ribbon shaped tissue marker clip was deployed into the biopsy cavity. Follow up 2 view mammogram was performed and dictated separately.  Using sterile technique and 2% Lidocaine as local anesthetic, under direct ultrasound visualization, a 14 gauge spring-loaded device was used to perform biopsy of a prominent left axillary lymph node using an inferior approach. At the conclusion of the procedure a HydroMARK tissue marker clip was deployed into the biopsy cavity.  IMPRESSION: Ultrasound guided biopsy of a left breast mass and a prominent left axillary lymph node. No apparent complications.  Electronically Signed: By: Lajean Manes M.D. On: 03/18/2015 16:59    ASSESSMENT: 74 y.o. [place] woman status post left breast biopsy 03/18/2015 for a clinical T2 N0, stage IIA invasive ductal carcinoma, grade 3, estrogen receptor weakly positive, progesterone receptor negative, with an MIB-1 of 90%, and HER-2 not amplified  (1) genetics testing pending  PLAN: We spent the better part of today's hour-plus appointment discussing the biology of breast cancer in general, and the specifics of the patient's tumor  in particular. We reviewed the fact that this is a large, aggressive, fast growing breast cancer and that the chances of it already having spread microscopically to other parts of Tedi's body is high. She  is already set up for a PET scan later this week and hopefully it will be negative. Of course a PET scan only reliably detects active masses greater than one half to 1 cm. It could not detect microscopic disease by definition.  If the patient turns out to have stage IV disease, which hopefully is not the case, then an entirely different discussion would have to take place. Today however I discussed her situation in terms of the available data, which is for locally advanced but curable breast cancer.  We discussed the difference between local and systemic treatment. I suggested that we start with neoadjuvant chemotherapy, and specifically proposed carboplatin and docetaxel at least 4 cycles, preferably 6. I quoted her a chance of having a complete pathologic response with those treatments of 40%--she understands that would mean when the surgeon removed the part of the breast for the cancer was, there would be no microscopic cancer left. Patients to attain a complete pathologic response have a very good long-term prognosis  We also discussed the possible toxicities, side effects and complications of this kind of chemotherapy. We discussed the fact that doing chemotherapy before surgery does not change the ultimate result, which is the same as when surgery is done before chemotherapy.   The patient was very appreciative of this discussion and she understood it fully. I also wrote it all down for her. However her plan is to proceed to mastectomy and then receive neither radiation nor adjuvant chemotherapy or adjuvant anti-estrogens. She is already being treated through Dr. Burna Forts office with agents that the patient understands are "taking care of the estrogen problem".   She is very concerned regarding  radiation, and I explained that radiation is standard after lumpectomy but after mastectomy it is generally reserved for either very large tumors or note positive tumors.  Finally she is concerned about the possibility of lymphedema, since her mother had what sounds like grade 3 lymphedema for most of her life after her breast cancer surgery. Unfortunately the Alliance trial is only available for patients who undergo neoadjuvant treatment. She understands the standard of care if a positive lymph node is found is for complete axillary dissection. However at this point she is planning to refuse that regardless of the sentinel lymph node findings.  I will be glad to participate in Liyat's care to the extent that she wishes me to. Tentatively I am making a return appointment here for late July, but of course I will be glad to see her at any point if and when the need arises.    The patient has a good understanding of the overall plan. She agrees with it. She knows the goal of treatment in her case is cure. She will call with any problems that may develop before her next visit here.  Chauncey Cruel, MD   04/12/2015 6:02 PM Medical Oncology and Hematology [facility] [address] [place], [place] [address] Tel. [phone]    Fax. [phone]

## 2015-04-13 ENCOUNTER — Telehealth: Payer: Self-pay | Admitting: Oncology

## 2015-04-13 NOTE — Telephone Encounter (Signed)
Patient confirmed appointment for 7/26

## 2015-04-14 ENCOUNTER — Encounter (HOSPITAL_COMMUNITY)
Admission: RE | Admit: 2015-04-14 | Discharge: 2015-04-14 | Disposition: A | Discharge status: Home / Self Care | Payer: Medicare Other | Source: Ambulatory Visit | Attending: General Surgery | Admitting: General Surgery

## 2015-04-14 ENCOUNTER — Ambulatory Visit (HOSPITAL_COMMUNITY): Admission: RE | Admit: 2015-04-14 | Payer: Medicare Other | Source: Ambulatory Visit

## 2015-04-14 ENCOUNTER — Encounter (HOSPITAL_COMMUNITY): Payer: Self-pay

## 2015-04-14 DIAGNOSIS — Z01812 Encounter for preprocedural laboratory examination: Secondary | ICD-10-CM | POA: Insufficient documentation

## 2015-04-14 DIAGNOSIS — Z79899 Other long term (current) drug therapy: Secondary | ICD-10-CM | POA: Diagnosis not present

## 2015-04-14 DIAGNOSIS — C50912 Malignant neoplasm of unspecified site of left female breast: Secondary | ICD-10-CM | POA: Diagnosis not present

## 2015-04-14 DIAGNOSIS — Z01811 Encounter for preprocedural respiratory examination: Secondary | ICD-10-CM

## 2015-04-14 HISTORY — DX: Asymptomatic varicose veins of unspecified lower extremity: I83.90

## 2015-04-14 HISTORY — DX: Family history of other specified conditions: Z84.89

## 2015-04-14 HISTORY — DX: Allergy to other foods: Z91.018

## 2015-04-14 LAB — CBC WITH DIFFERENTIAL/PLATELET
Basophils Absolute: 0 10*3/uL (ref 0.0–0.1)
Basophils Relative: 1 % (ref 0–1)
Eosinophils Absolute: 0.1 10*3/uL (ref 0.0–0.7)
Eosinophils Relative: 3 % (ref 0–5)
HEMATOCRIT: 43.3 % (ref 36.0–46.0)
Hemoglobin: 14.5 g/dL (ref 12.0–15.0)
LYMPHS ABS: 1.2 10*3/uL (ref 0.7–4.0)
Lymphocytes Relative: 28 % (ref 12–46)
MCH: 30.8 pg (ref 26.0–34.0)
MCHC: 33.5 g/dL (ref 30.0–36.0)
MCV: 91.9 fL (ref 78.0–100.0)
MONOS PCT: 12 % (ref 3–12)
Monocytes Absolute: 0.5 10*3/uL (ref 0.1–1.0)
NEUTROS ABS: 2.3 10*3/uL (ref 1.7–7.7)
Neutrophils Relative %: 56 % (ref 43–77)
Platelets: 211 10*3/uL (ref 150–400)
RBC: 4.71 MIL/uL (ref 3.87–5.11)
RDW: 13.4 % (ref 11.5–15.5)
WBC: 4.1 10*3/uL (ref 4.0–10.5)

## 2015-04-14 LAB — BASIC METABOLIC PANEL
ANION GAP: 9 (ref 5–15)
BUN: 12 mg/dL (ref 6–20)
CO2: 26 mmol/L (ref 22–32)
Calcium: 9.7 mg/dL (ref 8.9–10.3)
Chloride: 100 mmol/L — ABNORMAL LOW (ref 101–111)
Creatinine, Ser: 0.8 mg/dL (ref 0.44–1.00)
GFR calc Af Amer: >60 mL/min (ref >60)
GLUCOSE: 98 mg/dL (ref 65–99)
Potassium: 4 mmol/L (ref 3.5–5.1)
SODIUM: 135 mmol/L (ref 135–145)

## 2015-04-14 LAB — APTT: APTT: 29 s (ref 24–37)

## 2015-04-14 LAB — PROTIME-INR
INR: 1.11 (ref 0.00–1.49)
Prothrombin Time: 14.5 seconds (ref 11.6–15.2)

## 2015-04-14 NOTE — Pre-Procedure Instructions (Signed)
Megan Case  04/14/2015     No Pharmacies Listed   Your procedure is scheduled on June 21.  Report to [facility] Admitting at 1045 A.M.  Call this number if you have problems the morning of surgery:  [phone]   Remember:  Do not eat food or drink liquids after midnight.  Take these medicines the morning of surgery with A SIP OF WATER na  Stop taking Aspirin, Ibuprofen, Aleve, BC's, Goody's, Herbal medications, Fish Oil   Do not wear jewelry, make-up or nail polish.  Do not wear lotions, powders, or perfumes.  You may wear deodorant.  Do not shave 48 hours prior to surgery.  Men may shave face and neck.  Do not bring valuables to the hospital.  [facility] is not responsible for any belongings or valuables.  Contacts, dentures or bridgework may not be worn into surgery.  Leave your suitcase in the car.  After surgery it may be brought to your room.  For patients admitted to the hospital, discharge time will be determined by your treatment team.  Patients discharged the day of surgery will not be allowed to drive home.    Special instructions:  [facility] - Preparing for Surgery  Before surgery, you can play an important role.  Because skin is not sterile, your skin needs to be as free of germs as possible.  You can reduce the number of germs on you skin by washing with CHG (chlorahexidine gluconate) soap before surgery.  CHG is an antiseptic cleaner which kills germs and bonds with the skin to continue killing germs even after washing.  Please DO NOT use if you have an allergy to CHG or antibacterial soaps.  If your skin becomes reddened/irritated stop using the CHG and inform your nurse when you arrive at Short Stay.  Do not shave (including legs and underarms) for at least 48 hours prior to the first CHG shower.  You may shave your face.  Please follow these instructions carefully:   1.  Shower with CHG Soap the night before surgery and the    morning of  Surgery.  2.  If you choose to wash your hair, wash your hair first as usual with your   normal shampoo.  3.  After you shampoo, rinse your hair and body thoroughly to remove the   Shampoo.  4.  Use CHG as you would any other liquid soap.  You can apply chg directly to the skin and wash gently with scrungie or a clean washcloth.  5.  Apply the CHG Soap to your body ONLY FROM THE NECK DOWN.   Do not use on open wounds or open sores.  Avoid contact with your eyes,  ears, mouth and genitals (private parts).  Wash genitals (private parts) with your normal soap.  6.  Wash thoroughly, paying special attention to the area where your surgery will be performed.  7.  Thoroughly rinse your body with warm water from the neck down.  8.  DO NOT shower/wash with your normal soap after using and rinsing off  the CHG Soap.  9.  Pat yourself dry with a clean towel.            10.  Wear clean pajamas.            11.  Place clean sheets on your bed the night of your first shower and do not sleep with pets.  Day of Surgery  Do  not apply any lotions/deoderants the morning of surgery.  Please wear clean clothes to the hospital/surgery center.     Please read over the following fact sheets that you were given. Pain Booklet, Coughing and Deep Breathing and Surgical Site Infection Prevention

## 2015-04-14 NOTE — Progress Notes (Signed)
Pt states she will not sign consent because she is not having a axillary dissection. Dr Darrel Hoover office called spoke with Jackelyn Poling and inforned her of above. Consent will be signed on day of surgery.  Pt sees Dr Mingo Amber [phone]) and states that she will be taking care of her Cancer after surgery. Denies having a  Stress test, echo or card cath.  Pt states she is not sure she will be able to use the CHG soap, instructed to do a test and see if she can tolerate it, if not to use antibacterial soap without fragrance. Voices understanding.   Pt refused to have CXR today, states she is having a PET scan tomorrow and does not want the exposure for both.

## 2015-04-15 ENCOUNTER — Encounter (HOSPITAL_COMMUNITY)
Admission: RE | Admit: 2015-04-15 | Discharge: 2015-04-15 | Disposition: A | Discharge status: Home / Self Care | Payer: Medicare Other | Source: Ambulatory Visit | Attending: General Surgery | Admitting: General Surgery

## 2015-04-15 DIAGNOSIS — Z79899 Other long term (current) drug therapy: Secondary | ICD-10-CM | POA: Insufficient documentation

## 2015-04-15 DIAGNOSIS — R918 Other nonspecific abnormal finding of lung field: Secondary | ICD-10-CM | POA: Insufficient documentation

## 2015-04-15 DIAGNOSIS — C50919 Malignant neoplasm of unspecified site of unspecified female breast: Secondary | ICD-10-CM | POA: Diagnosis not present

## 2015-04-15 DIAGNOSIS — C50912 Malignant neoplasm of unspecified site of left female breast: Secondary | ICD-10-CM | POA: Diagnosis not present

## 2015-04-15 DIAGNOSIS — K769 Liver disease, unspecified: Secondary | ICD-10-CM | POA: Insufficient documentation

## 2015-04-15 LAB — GLUCOSE, CAPILLARY: GLUCOSE-CAPILLARY: 88 mg/dL (ref 65–99)

## 2015-04-15 MED ORDER — FLUDEOXYGLUCOSE F - 18 (FDG) INJECTION
6.0300 | Freq: Once | INTRAVENOUS | Status: AC | PRN
Start: 2015-04-15 — End: 2015-04-15
  Administered 2015-04-15: 6.03 via INTRAVENOUS

## 2015-04-17 ENCOUNTER — Other Ambulatory Visit: Payer: Self-pay | Admitting: General Surgery

## 2015-04-17 DIAGNOSIS — C50112 Malignant neoplasm of central portion of left female breast: Secondary | ICD-10-CM

## 2015-04-18 DIAGNOSIS — M542 Cervicalgia: Secondary | ICD-10-CM | POA: Diagnosis not present

## 2015-04-18 DIAGNOSIS — M9901 Segmental and somatic dysfunction of cervical region: Secondary | ICD-10-CM | POA: Diagnosis not present

## 2015-04-18 MED ORDER — VANCOMYCIN HCL IN DEXTROSE 1-5 GM/200ML-% IV SOLN
1000.0000 mg | INTRAVENOUS | Status: AC
  Administered 2015-04-19: 1000 mg via INTRAVENOUS
  Filled 2015-04-18: qty 200

## 2015-04-18 NOTE — H&P (Signed)
Megan Case. [place]  Location: [facility] Patient #: [id number] DOB: [birth year] Married / Language: English / Race: White Female        History of Present Illness   The patient is a 74 year old female who presents with breast cancer. This is a pleasant 74 year old Caucasian female who returns to see me for a second preoperative visit discuss management of the locally advanced cancer in her upper central left breast.   PET CT detailed below . No gross distant metastatic disease She is scheduled for genetics counseling on Tuesday, June 14.  She has seen Dr. Mingo Amber again and states that she now would like to see a medical oncologist to discuss their point of view, but she still is negative about chemotherapy, radiation therapy and antiestrogen therapy. She states that she would also like to go ahead and schedule a left total mastectomy and sentinel node biopsy. She is taking homeopathic medications and is going to continue that. These are being prescribed by Dr. Mingo Amber. I told her that was fine but I would like a list of the medicines.  She gives a history of a lump in the left breast for a year. This has grown more rapidly since she had a biopsy. No pain. No skin change. Imaging studies show a large lobulated mass in the 12 o'clock position left breast, mid depth measuring 3.9 cm. This is a solitary finding. It is larger on exam. The right breast looks fine. The right axilla is clinically negative. Ultrasound of the left axilla showed a questionably abnormal lymph node.  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 The lymph node biopsy was benign.  In terms of surgery she definitely wants to go ahead with a mastectomy and does not want any neoadjuvant therapy. We talked about reconstruction and she is not interested in that. I told her we could probably get a mastectomy done.  Somewhat increased risk of a positive margin but probably is going to be okay.  We spent a long time body about what to do with the axilla. I told her that she clearly should have a left axillary sentinel node biopsy. Because of the size of her tumor which I think is a T3 on exam, I would typically do imprint cytology and if she had positive nodes I would proceed with a left axillary lymph node dissection. She is skeptical about that because of arm swelling and sensory deficit. I told her she could think about that and let me know. I told her we did not have to do that but it was the standard of care. I told her if she had positive axillary lymph nodes that antiestrogen therapy and radiation therapy and even chemotherapy would be part of her treatment offered. She still states she does not want any of that done.  She does want to have a consultation with medical oncology, and she wants to see Dr. Jana Hakim. We are going to arrange for that to be done. Hopefully preop. She was to go ahead with surgery the week of June 20 if we can get her on the schedule.  I have discussed the indications, details, techniques, and numerous risk of mastectomy and axillary lymph node biopsy with her and her husband and her daughter. She is aware of the risk of bleeding, infection, skin necrosis, arm swelling, arm numbness, shoulder disability, reoperative reoperation or complications. She understands all of these issues.  All of her questions were answered. She agrees with this plan.     Allergies  Latex BEE VENOM Penicillin G Benzathine & Proc *PENICILLINS* Codeine Phosphate *ANALGESICS - OPIOID*  Medication History Elbert Ewings, CMA; 04/08/2015 1:34 PM) Nature-Throid (65MG  Tablet, Oral six days a week) Active. Medications Reconciled  Vitals   Weight: 121 lb Height: 62in Body Surface Area: 1.55 m Body Mass Index: 22.13 kg/m Temp.: 98.96F(Oral)  Pulse: 82 (Regular)  Resp.: 17  (Unlabored)  BP: 120/70 (Sitting, Left Arm, Standard)    Physical Exam General Note: Alert and oriented. No distress. Appears fit for her age. Good performance status.   Head and Neck Note: No adenopathy or mass. Good range of motion.   Chest and Lung Exam Note: Clear to auscultation bilaterally   Breast Note: Large mass in the upper central left breast. 6 or 7 cm diameter. Situated just above the nipple and areola. Not invading skin. Does not appear to be invading muscle. No other masses in either breast. No axillary adenopathy on either side.   Abdomen Note: Soft. Nontender. No mass. No hernia.     Assessment & Plan   CANCER OF CENTRAL PORTION OF LEFT BREAST (174.1  C50.112)   Schedule for Surgery We have had a another long conversation about management of your left breast cancer. You have stated that she would like to proceed with scheduling of left total mastectomy and left axillary sentinel lymph node biopsy.  She has declined ALND regardless of node status following lengthy discussion   PET/CT shows the breast cancer, questionable activity in axillary nodes and 6 mm LUL lung nodule that will need to be followed, significance unknown.   You are scheduled for genetic counseling on Tuesday, June 14 You stated that you would now like to see a medical oncologist, but that you are still opposed to radiation therapy, chemotherapy and antiestrogen therapy. We will schedule you for an appointment with the medical oncologist as soon as possible, hopefully preoperatively.  We will try to schedule you for a left mastectomy and sentinel node biopsy the week of June 20,  We have discussed the indications, techniques, and numerous risk of this surgery with you and your family. Please read the printed information that I gave you last time.  FAMILY HISTORY OF BREAST CANCER (V16.3  Z80.3)   Edsel Petrin. Dalbert Batman, M.D., [facility] General  and Minimally invasive Surgery Breast and Colorectal Surgery Office:   [phone] Pager:   [phone]

## 2015-04-19 ENCOUNTER — Encounter (HOSPITAL_COMMUNITY)
Admission: RE | Admit: 2015-04-19 | Discharge: 2015-04-19 | Disposition: A | Discharge status: Home / Self Care | Payer: Medicare Other | Source: Ambulatory Visit | Attending: General Surgery | Admitting: General Surgery

## 2015-04-19 ENCOUNTER — Ambulatory Visit (HOSPITAL_COMMUNITY): Payer: Medicare Other | Admitting: Anesthesiology

## 2015-04-19 ENCOUNTER — Ambulatory Visit (HOSPITAL_COMMUNITY)
Admission: RE | Admit: 2015-04-19 | Discharge: 2015-04-20 | Disposition: A | Discharge status: Home / Self Care | Payer: Medicare Other | Source: Ambulatory Visit | Attending: General Surgery | Admitting: General Surgery

## 2015-04-19 ENCOUNTER — Encounter (HOSPITAL_COMMUNITY): Payer: Self-pay | Admitting: Surgery

## 2015-04-19 ENCOUNTER — Encounter (HOSPITAL_COMMUNITY)
Admission: RE | Disposition: A | Discharge status: Home / Self Care | Payer: Self-pay | Source: Ambulatory Visit | Attending: General Surgery

## 2015-04-19 DIAGNOSIS — Z91011 Allergy to milk products: Secondary | ICD-10-CM | POA: Diagnosis not present

## 2015-04-19 DIAGNOSIS — Z9102 Food additives allergy status: Secondary | ICD-10-CM | POA: Insufficient documentation

## 2015-04-19 DIAGNOSIS — Z803 Family history of malignant neoplasm of breast: Secondary | ICD-10-CM | POA: Diagnosis not present

## 2015-04-19 DIAGNOSIS — R918 Other nonspecific abnormal finding of lung field: Secondary | ICD-10-CM | POA: Diagnosis not present

## 2015-04-19 DIAGNOSIS — Z91018 Allergy to other foods: Secondary | ICD-10-CM | POA: Diagnosis not present

## 2015-04-19 DIAGNOSIS — Z880 Allergy status to penicillin: Secondary | ICD-10-CM | POA: Insufficient documentation

## 2015-04-19 DIAGNOSIS — C50119 Malignant neoplasm of central portion of unspecified female breast: Secondary | ICD-10-CM | POA: Diagnosis present

## 2015-04-19 DIAGNOSIS — C50912 Malignant neoplasm of unspecified site of left female breast: Secondary | ICD-10-CM

## 2015-04-19 DIAGNOSIS — C50112 Malignant neoplasm of central portion of left female breast: Secondary | ICD-10-CM | POA: Diagnosis present

## 2015-04-19 DIAGNOSIS — Z888 Allergy status to other drugs, medicaments and biological substances status: Secondary | ICD-10-CM | POA: Diagnosis not present

## 2015-04-19 DIAGNOSIS — Z885 Allergy status to narcotic agent status: Secondary | ICD-10-CM | POA: Insufficient documentation

## 2015-04-19 DIAGNOSIS — Z91040 Latex allergy status: Secondary | ICD-10-CM | POA: Diagnosis not present

## 2015-04-19 DIAGNOSIS — G8918 Other acute postprocedural pain: Secondary | ICD-10-CM | POA: Diagnosis not present

## 2015-04-19 HISTORY — DX: Scoliosis, unspecified: M41.9

## 2015-04-19 HISTORY — DX: Autoimmune thyroiditis: E06.3

## 2015-04-19 HISTORY — PX: MASTECTOMY W/ SENTINEL NODE BIOPSY: SHX2001

## 2015-04-19 HISTORY — PX: MASTECTOMY COMPLETE / SIMPLE W/ SENTINEL NODE BIOPSY: SUR846

## 2015-04-19 HISTORY — DX: Pleurisy: R09.1

## 2015-04-19 HISTORY — DX: Unspecified osteoarthritis, unspecified site: M19.90

## 2015-04-19 LAB — CBC
HCT: 40.4 % (ref 36.0–46.0)
Hemoglobin: 13.4 g/dL (ref 12.0–15.0)
MCH: 30.4 pg (ref 26.0–34.0)
MCHC: 33.2 g/dL (ref 30.0–36.0)
MCV: 91.6 fL (ref 78.0–100.0)
Platelets: 171 10*3/uL (ref 150–400)
RBC: 4.41 MIL/uL (ref 3.87–5.11)
RDW: 13.7 % (ref 11.5–15.5)
WBC: 6.6 10*3/uL (ref 4.0–10.5)

## 2015-04-19 LAB — CREATININE, SERUM
Creatinine, Ser: 1.01 mg/dL — ABNORMAL HIGH (ref 0.44–1.00)
GFR calc Af Amer: >60 mL/min (ref >60)
GFR calc non Af Amer: 55 mL/min — ABNORMAL LOW (ref >60)

## 2015-04-19 SURGERY — MASTECTOMY WITH SENTINEL LYMPH NODE BIOPSY
Anesthesia: General | Site: Breast | Laterality: Left

## 2015-04-19 MED ORDER — FENTANYL CITRATE (PF) 250 MCG/5ML IJ SOLN
INTRAMUSCULAR | Status: AC
  Filled 2015-04-19: qty 5

## 2015-04-19 MED ORDER — 0.9 % SODIUM CHLORIDE (POUR BTL) OPTIME
TOPICAL | Status: DC | PRN
  Administered 2015-04-19: 1000 mL

## 2015-04-19 MED ORDER — DEXAMETHASONE SODIUM PHOSPHATE 4 MG/ML IJ SOLN
INTRAMUSCULAR | Status: AC
  Filled 2015-04-19: qty 2

## 2015-04-19 MED ORDER — SODIUM CHLORIDE 0.9 % IJ SOLN
INTRAMUSCULAR | Status: DC | PRN
  Administered 2015-04-19: 5 mL

## 2015-04-19 MED ORDER — MIDAZOLAM HCL 2 MG/2ML IJ SOLN
INTRAMUSCULAR | Status: AC
Start: 2015-04-19 — End: 2015-04-19
  Filled 2015-04-19: qty 2

## 2015-04-19 MED ORDER — FENTANYL CITRATE (PF) 100 MCG/2ML IJ SOLN
INTRAMUSCULAR | Status: AC
  Administered 2015-04-19: 50 ug
  Filled 2015-04-19: qty 2

## 2015-04-19 MED ORDER — GLYCOPYRROLATE 0.2 MG/ML IJ SOLN
INTRAMUSCULAR | Status: AC
  Filled 2015-04-19: qty 6

## 2015-04-19 MED ORDER — MIDAZOLAM HCL 2 MG/2ML IJ SOLN
INTRAMUSCULAR | Status: AC
  Administered 2015-04-19: 1 mg
  Filled 2015-04-19: qty 2

## 2015-04-19 MED ORDER — EPHEDRINE SULFATE 50 MG/ML IJ SOLN
INTRAMUSCULAR | Status: DC | PRN
  Administered 2015-04-19: 5 mg via INTRAVENOUS
  Administered 2015-04-19: 10 mg via INTRAVENOUS

## 2015-04-19 MED ORDER — FENTANYL CITRATE (PF) 100 MCG/2ML IJ SOLN: 25.0000 ug | INTRAMUSCULAR | Status: DC | PRN

## 2015-04-19 MED ORDER — PROPOFOL 10 MG/ML IV BOLUS
INTRAVENOUS | Status: DC | PRN
  Administered 2015-04-19: 130 mg via INTRAVENOUS

## 2015-04-19 MED ORDER — FENTANYL CITRATE (PF) 100 MCG/2ML IJ SOLN
25.0000 ug | INTRAMUSCULAR | Status: DC | PRN
  Administered 2015-04-19: 25 ug via INTRAVENOUS
  Filled 2015-04-19: qty 2

## 2015-04-19 MED ORDER — HYDROCODONE-ACETAMINOPHEN 5-325 MG PO TABS: 1.0000 | ORAL_TABLET | ORAL | Status: DC | PRN

## 2015-04-19 MED ORDER — DEXAMETHASONE SODIUM PHOSPHATE 4 MG/ML IJ SOLN
INTRAMUSCULAR | Status: DC | PRN
  Administered 2015-04-19: 6 mg via INTRAVENOUS

## 2015-04-19 MED ORDER — MIDAZOLAM HCL 5 MG/5ML IJ SOLN
INTRAMUSCULAR | Status: DC | PRN
  Administered 2015-04-19: 2 mg via INTRAVENOUS

## 2015-04-19 MED ORDER — OXYCODONE HCL 5 MG/5ML PO SOLN: 5.0000 mg | Freq: Once | ORAL | Status: DC | PRN

## 2015-04-19 MED ORDER — NEOSTIGMINE METHYLSULFATE 10 MG/10ML IV SOLN
INTRAVENOUS | Status: AC
  Filled 2015-04-19: qty 2

## 2015-04-19 MED ORDER — ONDANSETRON HCL 4 MG PO TABS: 4.0000 mg | ORAL_TABLET | Freq: Four times a day (QID) | ORAL | Status: DC | PRN

## 2015-04-19 MED ORDER — CHLORHEXIDINE GLUCONATE 4 % EX LIQD: 1.0000 "application " | Freq: Once | CUTANEOUS | Status: DC

## 2015-04-19 MED ORDER — ONDANSETRON HCL 4 MG/2ML IJ SOLN: 4.0000 mg | Freq: Once | INTRAMUSCULAR | Status: DC | PRN

## 2015-04-19 MED ORDER — TECHNETIUM TC 99M SULFUR COLLOID FILTERED
1.0000 | Freq: Once | INTRAVENOUS | Status: AC | PRN
  Administered 2015-04-19: 1 via INTRADERMAL

## 2015-04-19 MED ORDER — POTASSIUM CHLORIDE IN NACL 20-0.9 MEQ/L-% IV SOLN
INTRAVENOUS | Status: DC
  Administered 2015-04-19 – 2015-04-20 (×2): via INTRAVENOUS
  Filled 2015-04-19 (×3): qty 1000

## 2015-04-19 MED ORDER — OXYCODONE HCL 5 MG PO TABS: 5.0000 mg | ORAL_TABLET | Freq: Once | ORAL | Status: DC | PRN

## 2015-04-19 MED ORDER — ONDANSETRON HCL 4 MG/2ML IJ SOLN
INTRAMUSCULAR | Status: DC | PRN
  Administered 2015-04-19: 4 mg via INTRAVENOUS

## 2015-04-19 MED ORDER — ENOXAPARIN SODIUM 40 MG/0.4ML ~~LOC~~ SOLN: 40.0000 mg | SUBCUTANEOUS | Status: DC

## 2015-04-19 MED ORDER — FENTANYL CITRATE (PF) 100 MCG/2ML IJ SOLN
INTRAMUSCULAR | Status: DC | PRN
  Administered 2015-04-19: 50 ug via INTRAVENOUS
  Administered 2015-04-19: 100 ug via INTRAVENOUS
  Administered 2015-04-19 (×2): 50 ug via INTRAVENOUS

## 2015-04-19 MED ORDER — METHYLENE BLUE 1 % INJ SOLN
INTRAMUSCULAR | Status: AC
  Filled 2015-04-19: qty 10

## 2015-04-19 MED ORDER — SODIUM CHLORIDE 0.9 % IJ SOLN
INTRAMUSCULAR | Status: AC
  Filled 2015-04-19: qty 10

## 2015-04-19 MED ORDER — LIDOCAINE HCL (CARDIAC) 20 MG/ML IV SOLN
INTRAVENOUS | Status: DC | PRN
  Administered 2015-04-19: 40 mg via INTRAVENOUS

## 2015-04-19 MED ORDER — ONDANSETRON HCL 4 MG/2ML IJ SOLN: 4.0000 mg | Freq: Four times a day (QID) | INTRAMUSCULAR | Status: DC | PRN

## 2015-04-19 MED ORDER — DEXTROSE 5 % IV SOLN
INTRAVENOUS | Status: DC | PRN
  Administered 2015-04-19: 12:00:00 via INTRAVENOUS

## 2015-04-19 MED ORDER — PHENYLEPHRINE HCL 10 MG/ML IJ SOLN
INTRAMUSCULAR | Status: DC | PRN
  Administered 2015-04-19 (×2): 80 ug via INTRAVENOUS

## 2015-04-19 MED ORDER — PROPOFOL 10 MG/ML IV BOLUS
INTRAVENOUS | Status: AC
  Filled 2015-04-19: qty 20

## 2015-04-19 MED ORDER — LACTATED RINGERS IV SOLN
INTRAVENOUS | Status: DC
  Administered 2015-04-19 (×3): via INTRAVENOUS

## 2015-04-19 SURGICAL SUPPLY — 56 items
ADH SKN CLS APL DERMABOND .7 (GAUZE/BANDAGES/DRESSINGS) ×2
APPLIER CLIP 9.375 MED OPEN (MISCELLANEOUS) ×4
APR CLP MED 9.3 20 MLT OPN (MISCELLANEOUS) ×2
BINDER BREAST LRG (GAUZE/BANDAGES/DRESSINGS) ×3 IMPLANT
BINDER BREAST XLRG (GAUZE/BANDAGES/DRESSINGS) IMPLANT
CANISTER SUCTION 2500CC (MISCELLANEOUS) ×8 IMPLANT
CHLORAPREP W/TINT 26ML (MISCELLANEOUS) ×4 IMPLANT
CLIP APPLIE 9.375 MED OPEN (MISCELLANEOUS) ×2 IMPLANT
CONT SPEC 4OZ CLIKSEAL STRL BL (MISCELLANEOUS) ×4 IMPLANT
COVER PROBE W GEL 5X96 (DRAPES) ×4 IMPLANT
COVER SURGICAL LIGHT HANDLE (MISCELLANEOUS) ×4 IMPLANT
DERMABOND ADVANCED (GAUZE/BANDAGES/DRESSINGS) ×2
DERMABOND ADVANCED .7 DNX12 (GAUZE/BANDAGES/DRESSINGS) ×2 IMPLANT
DEVICE DISSECT PLASMABLAD 3.0S (MISCELLANEOUS) ×2 IMPLANT
DRAIN CHANNEL 19F RND (DRAIN) ×7 IMPLANT
DRAPE CHEST BREAST 15X10 FENES (DRAPES) ×4 IMPLANT
DRAPE PROXIMA HALF (DRAPES) ×3 IMPLANT
ELECT BLADE 4.0 EZ CLEAN MEGAD (MISCELLANEOUS)
ELECT CAUTERY BLADE 6.4 (BLADE) ×4 IMPLANT
ELECT REM PT RETURN 9FT ADLT (ELECTROSURGICAL) ×8
ELECTRODE BLDE 4.0 EZ CLN MEGD (MISCELLANEOUS) ×1 IMPLANT
ELECTRODE REM PT RTRN 9FT ADLT (ELECTROSURGICAL) ×3 IMPLANT
EVACUATOR SILICONE 100CC (DRAIN) ×7 IMPLANT
GAUZE SPONGE 4X4 12PLY STRL (GAUZE/BANDAGES/DRESSINGS) ×3 IMPLANT
GLOVE BIOGEL PI IND STRL 7.0 (GLOVE) ×1 IMPLANT
GLOVE BIOGEL PI IND STRL 7.5 (GLOVE) ×1 IMPLANT
GLOVE BIOGEL PI INDICATOR 7.0 (GLOVE) ×2
GLOVE BIOGEL PI INDICATOR 7.5 (GLOVE) ×2
GLOVE SURG SS PI 7.0 STRL IVOR (GLOVE) ×6 IMPLANT
GLOVE SURG SS PI 7.5 STRL IVOR (GLOVE) ×6 IMPLANT
GOWN STRL REUS W/ TWL LRG LVL3 (GOWN DISPOSABLE) ×3 IMPLANT
GOWN STRL REUS W/ TWL XL LVL3 (GOWN DISPOSABLE) ×2 IMPLANT
GOWN STRL REUS W/TWL LRG LVL3 (GOWN DISPOSABLE) ×8
GOWN STRL REUS W/TWL XL LVL3 (GOWN DISPOSABLE) ×4
KIT BASIN OR (CUSTOM PROCEDURE TRAY) ×4 IMPLANT
KIT ROOM TURNOVER OR (KITS) ×4 IMPLANT
NDL 18GX1X1/2 (RX/OR ONLY) (NEEDLE) IMPLANT
NDL HYPO 25GX1X1/2 BEV (NEEDLE) IMPLANT
NEEDLE 18GX1X1/2 (RX/OR ONLY) (NEEDLE) ×4 IMPLANT
NEEDLE HYPO 25GX1X1/2 BEV (NEEDLE) ×4 IMPLANT
NS IRRIG 1000ML POUR BTL (IV SOLUTION) ×4 IMPLANT
PACK GENERAL/GYN (CUSTOM PROCEDURE TRAY) ×4 IMPLANT
PAD ABD 8X10 STRL (GAUZE/BANDAGES/DRESSINGS) ×3 IMPLANT
PAD ARMBOARD 7.5X6 YLW CONV (MISCELLANEOUS) ×10 IMPLANT
PLASMABLADE 3.0S (MISCELLANEOUS) ×4
SPECIMEN JAR X LARGE (MISCELLANEOUS) ×4 IMPLANT
SPONGE LAP 18X18 X RAY DECT (DISPOSABLE) ×3 IMPLANT
SUT ETHILON 3 0 FSL (SUTURE) ×7 IMPLANT
SUT MNCRL AB 4-0 PS2 18 (SUTURE) ×4 IMPLANT
SUT SILK 2 0 FS (SUTURE) ×4 IMPLANT
SUT VIC AB 3-0 SH 18 (SUTURE) ×7 IMPLANT
SYR CONTROL 10ML LL (SYRINGE) ×3 IMPLANT
TOWEL OR 17X24 6PK STRL BLUE (TOWEL DISPOSABLE) ×4 IMPLANT
TOWEL OR 17X26 10 PK STRL BLUE (TOWEL DISPOSABLE) ×4 IMPLANT
TUBE CONNECTING 12'X1/4 (SUCTIONS) ×1
TUBE CONNECTING 12X1/4 (SUCTIONS) ×3 IMPLANT

## 2015-04-19 NOTE — Interval H&P Note (Signed)
History and Physical Interval Note:  04/19/2015 12:12 PM  Megan Case  has presented today for surgery, with the diagnosis of cancer left breast  The various methods of treatment have been discussed with the patient and family. After consideration of risks, benefits and other options for treatment, the patient has consented to  Procedure(s): LEFT TOTAL MASTECTOMY WITH SENTINEL LYMPH NODE BIOPSY (Left) as a surgical intervention .  The patient's history has been reviewed, patient examined, no change in status, stable for surgery.  I have reviewed the patient's chart and labs.  Questions were answered to the patient's satisfaction.     Adin Hector

## 2015-04-19 NOTE — Anesthesia Preprocedure Evaluation (Signed)
Anesthesia Evaluation  Patient identified by MRN, date of birth, ID band Patient awake    Reviewed: Allergy & Precautions, NPO status , Patient's Chart, lab work & pertinent test results  Airway Mallampati: I  TM Distance: >3 FB Neck ROM: Full    Dental  (+) Teeth Intact, Dental Advisory Given   Pulmonary  breath sounds clear to auscultation        Cardiovascular Rhythm:Regular     Neuro/Psych    GI/Hepatic   Endo/Other    Renal/GU      Musculoskeletal   Abdominal   Peds  Hematology   Anesthesia Other Findings   Reproductive/Obstetrics                             Anesthesia Physical Anesthesia Plan  ASA: II  Anesthesia Plan: General   Post-op Pain Management:    Induction: Intravenous  Airway Management Planned: LMA  Additional Equipment:   Intra-op Plan:   Post-operative Plan:   Informed Consent:   Dental advisory given  Plan Discussed with: CRNA and Anesthesiologist  Anesthesia Plan Comments:         Anesthesia Quick Evaluation

## 2015-04-19 NOTE — Anesthesia Procedure Notes (Addendum)
Procedure Name: LMA Insertion Date/Time: 04/19/2015 1:08 PM Performed by: Jacquiline Doe A Pre-anesthesia Checklist: Patient identified, Timeout performed, Emergency Drugs available, Suction available and Patient being monitored Patient Re-evaluated:Patient Re-evaluated prior to inductionOxygen Delivery Method: Circle system utilized Preoxygenation: Pre-oxygenation with 100% oxygen Intubation Type: IV induction Ventilation: Mask ventilation without difficulty LMA: LMA inserted LMA Size: 3.0 Tube type: Oral Number of attempts: 1 Placement Confirmation: breath sounds checked- equal and bilateral and positive ETCO2 Tube secured with: Tape Dental Injury: Teeth and Oropharynx as per pre-operative assessment    Anesthesia Regional Block:  Pectoralis block  Pre-Anesthetic Checklist: ,, timeout performed, Correct Patient, Correct Site, Correct Laterality, Correct Procedure, Correct Position, site marked, Risks and benefits discussed,  Surgical consent,  Pre-op evaluation,  At surgeon's request and post-op pain management  Laterality: Left  Prep: chloraprep       Needles:  Injection technique: Single-shot  Needle Type: Echogenic Stimulator Needle     Needle Length: 9cm 9 cm Needle Gauge: 22 and 22 G    Additional Needles:  Procedures: ultrasound guided (picture in chart) Pectoralis block Narrative:  Start time: 04/19/2015 12:30 PM End time: 04/19/2015 12:35 PM Injection made incrementally with aspirations every 5 mL.  Performed by: Personally   Additional Notes: 30 cc 0.5% bupivacaine 1:200 Epi injected easily

## 2015-04-19 NOTE — Progress Notes (Signed)
Patient refused to sign consent form because it stated "left total mastectomy, left axillary sentinel node biopsy." Patient stated the word "axillary" should not be on consent form. Nurse called Dr. Dalbert Batman and informed him of patients concern and Dr. Dalbert Batman stated that the word "axillary" could be removed from the consent form. Will enter orders and have patient sign consent.

## 2015-04-19 NOTE — Op Note (Addendum)
Patient Name:           Megan Case   Date of Surgery:        04/19/2015  Pre op Diagnosis:      Locally advanced left breast cancer, clinical stage T3, N0, ER 30%, PR 0, HER-2 negative.  Post op Diagnosis:    Same  Procedure:                 Inject blue dye left breast                                      Left total mastectomy                                       Left axillary sentinel node biopsy                                       Shave biopsy superior skin margin with frozen section  Surgeon:                     Edsel Petrin. Dalbert Batman, M.D., FACS  Assistant:                      RNFA  Operative Indications:  This  patient is a 74 year old female who presents with breast cancer. This is a pleasant 74 year old Caucasian female who returns to see me for a second preoperative visit discuss management of the locally advanced cancer in her upper central left breast.     PET CT shows a tiny pulmonary nodule, left upper lobe, but  no gross distant metastatic disease     She has seen Dr. Jana Hakim, but has declined chemotherapy, radiation therapy, and antiestrogen therapy at this point in time.. She states that she would  like to go ahead and schedule a left total mastectomy and sentinel node biopsy. She is taking homeopathic medications and is going to continue that. These are being prescribed by Dr. Mingo Amber.  She gives a history of a lump in the left breast for a year. This has grown more rapidly since she had a biopsy. No pain. No skin change. Imaging studies show a large lobulated mass in the 12 o'clock position left breast, mid depth measuring 3.9 cm. This is a solitary finding. It is larger on exam. The right breast looks fine. The right axilla is clinically negative. Ultrasound of the left axilla showed a questionably abnormal lymph node.    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 The lymph node  biopsy was benign.      In terms of surgery she definitely wants to go ahead with a mastectomy and does not want any neoadjuvant therapy. We talked about reconstruction and she is not interested in that. I told her we could probably get a mastectomy done. Somewhat increased risk of a positive margin but probably is going to be okay.  We spent a long time talking  about what to do with the axilla. I told her that she clearly should have a left axillary sentinel node biopsy. Because of the size of her tumor which  I think is a T3 on exam, I would typically do imprint cytology and if she had positive nodes I would proceed with a left axillary lymph node dissection.  She has adamantly refused an axillary lymph node dissection, regardless of her lymph node status. I told her if she had positive axillary lymph nodes that antiestrogen therapy and radiation therapy and even chemotherapy would be part of her treatment offered. She still states she does not want any of that done.    She is brought to the operating room electively.    Operative Findings:       There was a large palpable tumor in the central and upper left breast.  This felt 6 cm diameter or more to me.  It had not invaded the skin yet.  It had not invaded the pectoralis muscle.  I found 2 sentinel lymph nodes that were sent for routine histology.  Because the closest skin margin was the superior flap, I shaved the edge of the skin and performed frozen section.  Dr. Lyndon Code stated that frozen section was negative.  Procedure in Detail:         The patient underwent injection of radionuclide in the left breast in the holding area.  She underwent pectoral block by Dr. Roberts Gaudy.   She was taken to the operating room and underwent general anesthesia with LMA device.  Intravenous antibiotic's were given.  Surgical timeout was performed.  Following alcohol prep I injected 5 mL of blue dye into the left breast, subareolar area.  This was methylene blue  mixed with saline in the breast was massaged for a few minutes.      I carefully planned and marked a transverse elliptical incision trying to encompass the tumor to get widely around this.  The incision was then made.  Skin flaps were raised superiorly to the infraclavicular area, medially to the parasternal area, inferiorly to the anterior rectus sheath, and laterally to the anterior border of the latissimus dorsi muscle.  I dissected up into the axilla and using the neoprobe removed 2 sentinel lymph nodes.  There were no other radioactively positive nodes.  I shaved the upper skin flap margin and sent that for frozen section.  Frozen section was reportedly negative for tumor cells.     Hemostasis was excellent and achieved with  electrocautery and metal clips.  The wound was irrigated with saline.  Two 37 French Blake drains were placed, one up into the left axilla and one across the skin flaps.  These were brought out through separate stab incisions inferolaterally, sutured to the skin and connected to suction bulbs.  The mastectomy incision was closed in 2 layers.  Subcutaneous and dermis with interrupted 3-0 Vicryls and skin with running subcuticular 4-0 Monocryl and Dermabond.  Breast binder was placed and patient taken to recovery in stable condition.  EBL 75 mL.  Counts correct.  Complications none.     Edsel Petrin. Dalbert Batman, M.D., FACS General and Minimally Invasive Surgery Breast and Colorectal Surgery  04/19/2015 2:24 PM

## 2015-04-19 NOTE — Anesthesia Postprocedure Evaluation (Signed)
  Anesthesia Post-op Note  Patient: Megan Case  Procedure(s) Performed: Procedure(s): LEFT TOTAL MASTECTOMY WITH SENTINEL LYMPH NODE BIOPSY, SKIN MARGIN BIOPSY (Left)  Patient Location: PACU  Anesthesia Type:General  Level of Consciousness: awake, alert  and oriented  Airway and Oxygen Therapy: Patient Spontanous Breathing and Patient connected to nasal cannula oxygen  Post-op Pain: mild  Post-op Assessment: Post-op Vital signs reviewed, Patient's Cardiovascular Status Stable, Respiratory Function Stable, Patent Airway and Pain level controlled              Post-op Vital Signs: stable  Last Vitals:  Filed Vitals:   04/19/15 1553  BP: 123/55  Pulse: 81  Temp: 36.4 C  Resp: 16    Complications: No apparent anesthesia complications

## 2015-04-19 NOTE — Transfer of Care (Signed)
Immediate Anesthesia Transfer of Care Note  Patient: Megan Case  Procedure(s) Performed: Procedure(s): LEFT TOTAL MASTECTOMY WITH SENTINEL LYMPH NODE BIOPSY, SKIN MARGIN BIOPSY (Left)  Patient Location: PACU  Anesthesia Type:GA combined with regional for post-op pain  Level of Consciousness: sedated, patient cooperative and responds to stimulation  Airway & Oxygen Therapy: Patient Spontanous Breathing and Patient connected to nasal cannula oxygen  Post-op Assessment: Report given to RN, Post -op Vital signs reviewed and stable, Patient moving all extremities and Patient moving all extremities X 4  Post vital signs: Reviewed and stable  Last Vitals:  Filed Vitals:   04/19/15 1225  BP: 121/53  Pulse: 88  Temp:   Resp: 15    Complications: No apparent anesthesia complications

## 2015-04-19 NOTE — Progress Notes (Signed)
Pt admitted to 6N06 via bed from PACU.  Pt AAO X4.  Pt on 2L [place].  Pt has 18G to Rt wrist.  Pt has incision to left breast with dermabond and dry surgical dsg to in place, and JPs X 2 with bloody drainage.  Breast binder in place.  SCDs in place.

## 2015-04-20 ENCOUNTER — Encounter (HOSPITAL_COMMUNITY): Payer: Self-pay | Admitting: General Surgery

## 2015-04-20 DIAGNOSIS — Z885 Allergy status to narcotic agent status: Secondary | ICD-10-CM | POA: Diagnosis not present

## 2015-04-20 DIAGNOSIS — Z9102 Food additives allergy status: Secondary | ICD-10-CM | POA: Diagnosis not present

## 2015-04-20 DIAGNOSIS — Z91040 Latex allergy status: Secondary | ICD-10-CM | POA: Diagnosis not present

## 2015-04-20 DIAGNOSIS — Z91011 Allergy to milk products: Secondary | ICD-10-CM | POA: Diagnosis not present

## 2015-04-20 DIAGNOSIS — R918 Other nonspecific abnormal finding of lung field: Secondary | ICD-10-CM | POA: Diagnosis not present

## 2015-04-20 DIAGNOSIS — C50112 Malignant neoplasm of central portion of left female breast: Secondary | ICD-10-CM | POA: Diagnosis not present

## 2015-04-20 MED ORDER — HYDROCODONE-ACETAMINOPHEN 5-325 MG PO TABS: 1.0000 | ORAL_TABLET | ORAL | Status: DC | PRN

## 2015-04-20 NOTE — Discharge Instructions (Signed)
-  see above 

## 2015-04-20 NOTE — Progress Notes (Signed)
Megan Case to be D/C'd Home per MD order.  Discussed with the patient and all questions fully answered.  VSS, Skin clean, dry and intact without evidence of skin break down, no evidence of skin tears noted. IV catheter discontinued intact. Site without signs and symptoms of complications. Dressing and pressure applied.  An After Visit Summary was printed and given to the patient. Patient received prescription.  D/c education completed with patient/family including follow up instructions, medication list, d/c activities limitations if indicated, with other d/c instructions as indicated by MD - patient able to verbalize understanding, all questions fully answered.   Patient instructed to return to ED, call 911, or call MD for any changes in condition.   Patient escorted via [place], and D/C home via private auto.  [name] 04/20/2015 9:54 AM

## 2015-04-20 NOTE — Discharge Summary (Addendum)
Patient ID: Megan Case [id number] 74 y.o. 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  Operations: Procedure(s): LEFT TOTAL MASTECTOMY WITH SENTINEL LYMPH NODE BIOPSY, SKIN MARGIN BIOPSY  Admission Condition: good  Discharged Condition: good  Indication for Admission:   This is a pleasant 74 year old Caucasian female was evaluated recently as an outpatient to discuss management of a locally advanced cancer in her upper central left breast. She gives a history of a lump in the left breast for a year. This has grown more rapidly since she had a biopsy. No pain. No skin change. Imaging studies show a large lobulated mass in the 12 o'clock position left breast, mid depth measuring 3.9 cm. This is a solitary finding. It is larger on exam. The right breast looks fine. The right axilla is clinically negative. Ultrasound of the left axilla showed a questionably abnormal lymph node.  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 The lymph node biopsy was benign.     PET CT shows a tiny, 6 mm  pulmonary nodule, left upper lobe, but no gross distant metastatic disease  She has seen Dr. Jana Hakim, but has declined chemotherapy, radiation therapy, and antiestrogen therapy at this point in time.. She states that she would like to go ahead and schedule a left total mastectomy and sentinel node biopsy. She is taking homeopathic medications and is going to continue that. These are being prescribed by Dr. Mingo Amber.   In terms of surgery she definitely wants to go ahead with a mastectomy and does not want any neoadjuvant therapy. We talked about  reconstruction and she is not interested in that. I told her we could probably get a mastectomy done. Somewhat increased risk of a positive margin but probably is going to be okay.  We spent a long time talking about what to do with the axilla. I told her that she clearly should have a left axillary sentinel node biopsy. Because of the size of her tumor which I think is a T3 on exam, I would typically do imprint cytology and if she had positive nodes I would proceed with a left axillary lymph node dissection. She has adamantly refused an axillary lymph node dissection, regardless of her lymph node status. I told her if she had positive axillary lymph nodes that antiestrogen therapy and radiation therapy and even chemotherapy would be part of her treatment offered. She still states she does not want any of that done.  She is brought to the operating room electively.  Hospital Course: On the day of admission the patient was brought to the operating room and underwent a left total mastectomy and sentinel lymph node biopsy in the left axilla.There was a large palpable tumor in the central and upper left breast. This felt 6 cm diameter or more to me. It had not invaded the skin yet. It had not invaded the pectoralis muscle. I found 2 sentinel lymph nodes that were sent for routine histology. Because the closest skin margin was the superior flap, I shaved the edge of the skin and performed frozen section. Dr. Lyndon Code stated that frozen section was negative.     She had a couple of episodes of nausea and vomiting postoperatively, but  by the following morning that had resolved and she felt fine and was requesting discharge home.  On postop day 1 she was alert and comfortable.  Had been and relating in the halls a great deal.  She could move her left shoulder around without difficulty.  There was no sensory deficit of the left arm.  The mastectomy skin flaps were pink and viable.  There is no hematoma  residual fluid collection.  Both drains were functioning with serosanguineous drainage.  She was taught wound and drain care.  She was given a prescription for Norco for pain.  Diet and activities were discussed.  She was asked to return to see me in the office in 8-9 days for a wound and drain check.  I told her we would call the pathology report to her when it was available.  I encouraged her to also follow-up with Dr. Jana Hakim  in 3 or 4 weeks.    Consults: None  Significant Diagnostic Studies: Surgical pathology, pending   Treatments: surgery: Left total mastectomy and left axillary sentinel node biopsy, shave biopsy of superior skin margin with frozen section   Disposition: Home  Patient Instructions:    Medication List    TAKE these medications        CHROMIUM PO  Place 1 tablet under the tongue daily. "CHROMIUM CHLORIDE AX1C13" - vitamin d (special)     HOMEOPATHIC PRODUCTS SL  Place 1 tablet under the tongue daily. "LIMIK"     HOMEOPATHIC PRODUCTS SL  Place 1 tablet under the tongue daily. "AROTA"     HOMEOPATHIC PRODUCTS SL  Place 1 tablet under the tongue daily. "PETULA"     HOMEOPATHIC PRODUCTS SL  Place 1 tablet under the tongue at bedtime. "LESCHUK"     HOMEOPATHIC PRODUCTS SL  Place 1 tablet under the tongue at bedtime. "SCIRRHINUM"     HOMEOPATHIC PRODUCTS SL  Place 1 tablet under the tongue at bedtime. "TARAX-6V"     HOMEOPATHIC PRODUCTS PO  Take 1 tablet by mouth as needed (before CT scans). "ALPHA TOCOPHERD-A3"     HOMEOPATHIC PRODUCTS SL  Place 1 tablet under the tongue daily. "CARBON 98BX" - vitamin     HOMEOPATHIC PRODUCTS SL  Place 1 tablet under the tongue daily. "PYRIDOXAL" - vitamin     HOMEOPATHIC PRODUCTS SL  Place 1 tablet under the tongue daily. "WU JINSON" - vitamin     HYDROcodone-acetaminophen 5-325 MG per tablet  Commonly known as:  NORCO/VICODIN  Take 1-2 tablets by mouth every 4 (four) hours as needed for moderate pain.      NATURE-THROID 65 MG tablet  Generic drug:  thyroid  Take 65 mg by mouth daily before breakfast.     PROBIOTIC PO  Take 1 tablet by mouth 2 (two) times daily.        Activity: Ambulate as tolerated.  No heavy lifting or sports.  No driving for about 3 weeks. Diet: regular diet Wound Care: as directed  Follow-up:  With Dr. Dalbert Batman in 8-9 days .  Signed: Edsel Petrin. Dalbert Batman, M.D., FACS General and minimally invasive surgery Breast and Colorectal Surgery  04/20/2015, 5:55 AM

## 2015-04-28 DIAGNOSIS — M542 Cervicalgia: Secondary | ICD-10-CM | POA: Diagnosis not present

## 2015-04-28 DIAGNOSIS — M9901 Segmental and somatic dysfunction of cervical region: Secondary | ICD-10-CM | POA: Diagnosis not present

## 2015-04-28 DIAGNOSIS — C50112 Malignant neoplasm of central portion of left female breast: Secondary | ICD-10-CM | POA: Diagnosis not present

## 2015-04-29 ENCOUNTER — Telehealth: Payer: Self-pay | Admitting: Genetic Counselor

## 2015-04-29 ENCOUNTER — Encounter: Payer: Self-pay | Admitting: Genetic Counselor

## 2015-04-29 DIAGNOSIS — Z1379 Encounter for other screening for genetic and chromosomal anomalies: Secondary | ICD-10-CM

## 2015-04-29 DIAGNOSIS — C50112 Malignant neoplasm of central portion of left female breast: Secondary | ICD-10-CM

## 2015-04-29 DIAGNOSIS — Z809 Family history of malignant neoplasm, unspecified: Secondary | ICD-10-CM | POA: Insufficient documentation

## 2015-04-29 DIAGNOSIS — Z803 Family history of malignant neoplasm of breast: Secondary | ICD-10-CM | POA: Insufficient documentation

## 2015-04-29 NOTE — Progress Notes (Signed)
GENETIC TEST RESULTS  HPI: Ms. Mohar was previously seen in the [place] clinic due to a personal history of breast cancer, family history of breast and other cancers, and concerns regarding a hereditary predisposition to cancer. Please refer to our prior cancer genetics clinic note from April 12, 2015 for more information regarding Ms. Cortright's medical, social and family histories, and our assessment and recommendations, at the time. Ms. Lenhardt recent genetic test results were disclosed to her, as were recommendations warranted by these results. These results and recommendations are discussed in more detail below.  GENETIC TEST RESULTS: At the time of Ms. Chio's visit on 04/12/15, we recommended she pursue genetic testing of the 24-gene OvaNext Panel through [employer] [facility], [place]).   The OvaNext Panel includes next-generation sequencing and deletion/duplication analysis of the following 23 genes: ATM, BARD1, BRCA1, BRCA2, BRIP1, CDH1, CHEK2, MLH1, MRE11A, MSH2, MSH6, MUTYH, NBN, NF1, PALB2, PMS2, PTEN, RAD50, RAD51C, RAD51D, SMARCA4, STK11, and TP53.  Also included is deletion/duplication analysis (without sequencing) for 1 gene: EPCAM.  Those results are back, the report date for which is 04/29/15.  Genetic testing was normal, and did not reveal a deleterious mutation in these genes.  Additionally, no variants of uncertain significance (VUSs) were found.  The test report has been scanned into EPIC and is located under the Media tab.   We discussed with Ms. Alberg that since the current genetic testing is not perfect, it is possible there may be a gene mutation in one of these genes that current testing cannot detect, but that chance is small. We also discussed, that it is possible that another gene that has not yet been discovered, or that we have not yet tested, is responsible for the cancer diagnoses in the family, and it is, therefore, important to remain in touch with  cancer genetics in the future so that we can continue to offer Ms. Custodio the most up to date genetic testing.   CANCER SCREENING RECOMMENDATIONS: Though we still do not have an answer for why Ms. Simonich was diagnosed with breast cancer or an explanation for the family history of breast and other cancers, this result is likely reassuring and indicates that Ms. Sabey likely does not have an increased risk for a future cancer due to a mutation in one of these genes. This normal test also suggests that Ms. Bera's cancer was most likely not due to an inherited predisposition associated with one of these genes.  Most cancers happen by chance and this negative test suggests that her cancer falls into this category.  We also know that our chances for getting cancer increase as we get older, and breast cancer is a common cancer in women.  Most of Ms. Belcourt's affected relatives were diagnosed with breast cancer later in life.  We, therefore, recommended she continue to follow the cancer management and screening guidelines provided by her oncology and primary healthcare providers.   RECOMMENDATIONS FOR FAMILY MEMBERS: Women in this family might be at some increased risk of developing cancer, over the general population risk, simply due to the family history of cancer. We recommended women in this family have a yearly mammogram beginning at age 31, or 30 years younger than the earliest onset of cancer, an an annual clinical breast exam, and perform monthly breast self-exams. Ms. Ballman daughter should currently be having this screening, as she is in her 76s.  Women in this family should also have a gynecological exam as  recommended by their primary provider. All family members should have a colonoscopy by age 2.    FOLLOW-UP: Lastly, we discussed with Ms. Cardosa that cancer genetics is a rapidly advancing field and it is possible that new genetic tests will be appropriate for her and/or her family members in the future. We  encouraged her to remain in contact with cancer genetics on an annual basis so we can update her personal and family histories and let her know of advances in cancer genetics that may benefit this family.   Our contact number was provided. Ms. Maciolek questions were answered to her satisfaction, and she knows she is welcome to call us at anytime with additional questions or concerns.   Jeanine Luz, MS Genetic Counselor [email or web address] Phone: [phone]

## 2015-04-29 NOTE — Telephone Encounter (Signed)
Informed Megan Case that genetic test results came back and were negative for any genetic changes to 24 genes that would cause an increased risk for breast, ovarian, or other related cancers.  We still do not have an explanation for her breast cancer diagnosis or for the family history of cancer.  Cancer screening then is based upon her personal and family history of cancer.  Megan Case daughter should talk to her doctor about getting mammograms.

## 2015-05-04 ENCOUNTER — Ambulatory Visit: Payer: Medicare Other | Admitting: Physical Therapy

## 2015-05-09 ENCOUNTER — Ambulatory Visit: Payer: Medicare Other | Attending: General Surgery | Admitting: Physical Therapy

## 2015-05-09 DIAGNOSIS — Z9189 Other specified personal risk factors, not elsewhere classified: Secondary | ICD-10-CM | POA: Insufficient documentation

## 2015-05-09 NOTE — Therapy (Addendum)
[place], [place], [address] Phone: [phone]   Fax:  [phone]  Physical Therapy Evaluation  Patient Details  Name: Megan Case MRN: [id number] Date of Birth: 1941-02-11 Referring Provider:  Fanny Skates, MD  Encounter Date: 05/09/2015      PT End of Session - 05/09/15 1751    Visit Number 1   Number of Visits 1   PT Start Time 1350   PT Stop Time 1430   PT Time Calculation (min) 40 min      Past Medical History  Diagnosis Date  . Osteoporosis   . Multiple food allergies   . Varicose veins   . Family history of adverse reaction to anesthesia     states mother had bad reaction in 1975;  not sure what reaction was  . Breast cancer, left breast 02/2015  . Pleurisy 1950's  . Hashimoto's disease     "I'm on Naturethroid 65mg ; 6d/wk" (04/19/2015)  . Arthritis     "hips" (04/19/2015)  . Scoliosis     Past Surgical History  Procedure Laterality Date  . Sigmoidoscopy    . Mastectomy complete / simple w/ sentinel node biopsy Left 04/19/2015  . Breast biopsy Left 03/2015  . Mastectomy w/ sentinel node biopsy Left 04/19/2015    Procedure: LEFT TOTAL MASTECTOMY WITH SENTINEL LYMPH NODE BIOPSY, SKIN MARGIN BIOPSY;  Surgeon: Fanny Skates, MD;  Location: [facility];  Service: General;  Laterality: Left;    There were no vitals filed for this visit.  Visit Diagnosis:  At risk for lymphedema      Subjective Assessment - 05/09/15 1404    Subjective pt states seh feels "wonderful"  the thing that hurt the most are the tubes and was much better once those came out.  She has been doing exercising on her own and feels that the swelling she originally had has "come way down"  'He sent me here"  re " dr. Dalbert Batman    Pertinent History left mastectomy with 2 nodes removed 04/19/2015 drains taken out 8 days later.Marland Kitchen  Her follow up treatment will be homeopathic, not chemo or radiation   Patient Stated Goals came  her because her doctor sent her here   Currently in Pain? No/denies            [facility] PT Assessment - 05/09/15 0001    Balance Screen   Has the patient fallen in the past 6 months No   Has the patient had a decrease in activity level because of a fear of falling?  No   Is the patient reluctant to leave their home because of a fear of falling?  No   Home Ecologist residence   Prior Function   Level of Independence Independent   Cognition   Overall Cognitive Status Within Functional Limits for tasks assessed   Observation/Other Assessments   Observations well healing  with some glue intact.    Posture/Postural Control   Posture/Postural Control Postural limitations   Postural Limitations Rounded Shoulders;Forward head   AROM   Right Shoulder Flexion 140 Degrees   Right Shoulder ABduction 165 Degrees   Left Shoulder Flexion 140 Degrees   Left Shoulder ABduction 160 Degrees           LYMPHEDEMA/ONCOLOGY QUESTIONNAIRE - 05/09/15 1416    Right Upper Extremity Lymphedema   10 cm Proximal to Olecranon Process 23 cm   Olecranon Process 21.4 cm   10  cm Proximal to Ulnar Styloid Process 20 cm   Just Proximal to Ulnar Styloid Process 14.3 cm   Across Hand at [employer] 17 cm   At [place] of 2nd Digit 5.8 cm   Left Upper Extremity Lymphedema   10 cm Proximal to Olecranon Process 23.5 cm   Olecranon Process 22 cm   10 cm Proximal to Ulnar Styloid Process 19.5 cm   Just Proximal to Ulnar Styloid Process 14 cm   Across Hand at [employer] 17.4 cm   At [place] of 2nd Digit 5.5 cm           Quick Dash - 05/09/15 0001    Open a tight or new jar Mild difficulty   Do heavy household chores (wash walls, wash floors) No difficulty   Carry a shopping bag or briefcase No difficulty   Wash your back No difficulty   Use a knife to cut food No difficulty   Recreational activities in which you take some force or impact through your arm, shoulder, or  hand (golf, hammering, tennis) Mild difficulty   During the past week, to what extent has your arm, shoulder or hand problem interfered with your normal social activities with family, friends, neighbors, or groups? Not at all   During the past week, to what extent has your arm, shoulder or hand problem limited your work or other regular daily activities Not at all   Arm, shoulder, or hand pain. None   Tingling (pins and needles) in your arm, shoulder, or hand None   Difficulty Sleeping No difficulty   DASH Score 4.55 %             [facility] Adult PT Treatment/Exercise - 05/09/15 0001    Shoulder Exercises: Supine   Other Supine Exercises cane exercise, breast post op exrcise                 PT Education - 05/09/15 1440    Education provided Yes   Education Details ABC class handouts with verbal review of exercise , [employer] link for lymphedema exercise. breast post op exercise handout   Person(s) Educated Patient;Spouse   Methods Explanation;Handout   Comprehension Verbalized understanding                Long Term Clinic Goals - 05/09/15 1759    CC Long Term Goal  #1   Title pt will have infomation about lymphedema risk reduction   Status Achieved   CC Long Term Goal  #2   Title pt will be independent in home program for shoulder range of motion    Status Achieved            Plan - 05/09/15 1752    Clinical Impression Statement 74 yo pt post left mastectomy with 2 nodes removed who is not planning to have chemo or radiation therapy. She is doing well with range of motion and functional strengthe. Shereceived intial information about lymphedema risk reduction practices. She does not plan to have plane travel, so will likely not need a compression sleeve.  She will [facility] [employer] [facility] for a prosthesis and bra when ok'd by dr Dalbert Batman. She does not need follow up PT at this point, but was informed she can call us back if she has qustions    PT Frequency One  time visit   PT Home Exercise Plan breast post op shoulder exercise and stretches from [facility] Class   Recommended Other Services ABC  classs   Consulted and Agree with Plan of Care Patient;Family member/caregiver   Family Member Consulted husband          G-Codes - May 14, 2015 1801    Functional Assessment Tool Used Quick DASH    Functional Limitation Carrying, moving and handling objects   Carrying, Moving and Handling Objects Current Status [phone]) 0 percent impaired, limited or restricted   Carrying, Moving and Handling Objects Goal Status ([id number]) 0 percent impaired, limited or restricted   Carrying, Moving and Handling Objects Discharge Status [phone]) 0 percent impaired, limited or restricted       Problem List Patient Active Problem List   Diagnosis Date Noted  . Genetic testing 04/29/2015  . Family history of breast cancer in mother 04/29/2015  . Family history of breast cancer in female 04/29/2015  . Family history of cancer 04/29/2015  . Cancer of central portion of female breast 04/19/2015  . Osteoporosis 04/12/2015  . Cancer of central portion of left female breast 04/11/2015   Donato Heinz. Owens Shark, PT   May 14, 2015, 6:03 PM  [place], [place], [address] Phone: [phone]   Fax:  [phone]

## 2015-05-09 NOTE — Patient Instructions (Signed)
[email or web address] Courses  Online Strength after breast cancer Right of page  Lymphedema eduction session   Fist and third Monday of every month After Breast Cancer (ABC) class [phone] free class please call to register so we know how many people are coming

## 2015-05-10 DIAGNOSIS — M542 Cervicalgia: Secondary | ICD-10-CM | POA: Diagnosis not present

## 2015-05-10 DIAGNOSIS — M9901 Segmental and somatic dysfunction of cervical region: Secondary | ICD-10-CM | POA: Diagnosis not present

## 2015-05-24 ENCOUNTER — Telehealth: Payer: Self-pay | Admitting: Oncology

## 2015-05-24 ENCOUNTER — Ambulatory Visit (HOSPITAL_BASED_OUTPATIENT_CLINIC_OR_DEPARTMENT_OTHER): Payer: Medicare Other | Admitting: Oncology

## 2015-05-24 VITALS — BP 114/65 | HR 75 | Temp 98.1°F | Resp 16 | Wt 116.5 lb

## 2015-05-24 DIAGNOSIS — C50112 Malignant neoplasm of central portion of left female breast: Secondary | ICD-10-CM | POA: Diagnosis not present

## 2015-05-24 DIAGNOSIS — Z170 Estrogen receptor positive status [ER+]: Secondary | ICD-10-CM | POA: Diagnosis not present

## 2015-05-24 DIAGNOSIS — M810 Age-related osteoporosis without current pathological fracture: Secondary | ICD-10-CM

## 2015-05-24 NOTE — Telephone Encounter (Signed)
Mailed calendar for July 2017

## 2015-05-24 NOTE — Progress Notes (Signed)
[place]  Telephone:([phone]) [phone] [phone]) [phone]     ID: Megan Case DOB: September 13, 1941  MR#: [id number]  EHM#:[id number]  Patient Care Team: Leighton Ruff, MD as PCP - General (Family Medicine) PCP: Mingo Amber MD GYN: SU: Fanny Skates MD OTHER MD: Earle Gell MD  CHIEF COMPLAINT: Stage II breast cancer, weakly estrogen receptor positive  CURRENT TREATMENT: Homeopathy   BREAST CANCER HISTORY: From the original intake note:  Megan Case tells me she has a long history of problems in her breasts, with multiple aspirations and other procedures, for what always turned out to be benign disease. She did notice some changes in her left breast but then in Mar 08, 2023 her husband "died" with V. fib. He was successfully resuscitated and underwent CABG, but with all that going on she really did not pay much attention to her left breast changes.  She did eventually bring it to the attention of Dr. Jaynie Collins and on 03/18/2015 and underwent bilateral diagnostic mammography with tomosynthesis and left breast ultrasonography. On mammography there was a large lobulated mass in the upper outer quadrant of the left breast associated with architectural distortion. The rest of the breasts, which was density category B, showed some generalized thickening of the overlying skin but no other suspicious lesions and no suspicious calcifications. Targeted ultrasound showed a large solid heterogeneous mass in the area in question measuring 3.9 cm. In the left axilla was a single prominent lymph node with a thickened cortex measuring 4 mm. The node itself measured 9 mm.  On 03/18/2015 (the same day) the patient underwent biopsy of the breast mass and lymph node in question. 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  Her case was presented at the multidisciplinary breast cancer conference 03/30/2015. At that time it was felt that the patient's would benefit from genetics and neoadjuvant therapy. She will need staging studies and MRI was also recommended.  Her subsequent history is as detailed below  INTERVAL HISTORY: Megan Case returns today for follow-up of her breast cancer accompanied by her husband Gene. Since her last visit here she underwent left mastectomy with sentinel lymph node sampling, on 04/19/2015. The final pathology ([id number]) showed an invasive ductal carcinoma, grade 3, measuring 4.7 cm. Both sentinel lymph nodes were clear. Estrogen receptor was 30% positive on the prior determination. Progesterone receptor was negative. HER-2 was repeated and was again negative. Since her last visit also she underwent genetic testing. There was no deleterious mutation or the [place].  REVIEW OF SYSTEMS: Megan Case did well with her surgery, with no unusual pain, fever, bleeding, or other complications. She went to the physical therapy screening clinic and was found to be very flexible so no physical therapy was prescribed. She is not planning on reconstruction. Overall Megan Case feels she is back to baseline. A detailed review of systems today was otherwise negative.  PAST MEDICAL HISTORY: Past Medical History  Diagnosis Date  . Osteoporosis   . Multiple food allergies   . Varicose veins   . Family history of adverse reaction to anesthesia     states mother had bad reaction in 1975;  not sure what reaction was  . Breast cancer, left breast 2015/03/08  . Pleurisy 1950's  . Hashimoto's disease     "I'm on Naturethroid $RemoveBeforeD'65mg'JdKhNnIwFTowdn$ ; 6d/wk" (04/19/2015)  .  Arthritis     "hips" (04/19/2015)  . Scoliosis     PAST SURGICAL HISTORY: Past Surgical History  Procedure Laterality Date  . Sigmoidoscopy    . Mastectomy complete / simple w/ sentinel node biopsy Left 04/19/2015  . Breast biopsy Left 03/2015    . Mastectomy w/ sentinel node biopsy Left 04/19/2015    Procedure: LEFT TOTAL MASTECTOMY WITH SENTINEL LYMPH NODE BIOPSY, SKIN MARGIN BIOPSY;  Surgeon: Fanny Skates, MD;  Location: [facility];  Service: General;  Laterality: Left;    FAMILY HISTORY Family History  Problem Relation Age of Onset  . Breast cancer Mother 67  . Leukemia Brother   . Breast cancer Maternal Aunt   . Diabetes Maternal Uncle   . Heart Problems Maternal Uncle   . Cancer Maternal Grandmother 38    "female cancer"  . Diabetes Brother   . Heart attack Brother   . Bladder Cancer Cousin 6   The patient's father died at the age of 17 from a stroke. The patient's mother was diagnosed with breast cancer in her early 33s. She died at age 9 from unrelated causes. The patient's mother had one sister, diagnosed at age 44 with breast cancer. The patient herself had 2 brothers, no sisters. She does have a cousin on her mother's side diagnosed with breast cancer in her 13s. There is no history of ovarian cancer in the family.  GYNECOLOGIC HISTORY:  No LMP recorded. Patient is postmenopausal. Menarche age 85, first live birth age 69. The patient is GX P2. She went through the change of life approximately 1995. She took hormone replacement for at least 5 years. She also took oral contraceptives for approximately 14 years remotely, with no complications.  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 Her husband Megan Case "[employer] used to work for [employer]. Their children are Megan Case, who lives in [place] and is undergone of course superintendent, and Megan Case lives in [place] and runs an office for a [employer]. The patient has 6 grandchildren. She attends a Patent attorney    ADVANCED DIRECTIVES: In place. The patient's husband and her daughter Megan Case our joint has Our as of attorney. Megan Case can be reached at  [phone]   HEALTH MAINTENANCE: History  Substance Use Topics  . Smoking status: Never Smoker   . Smokeless tobacco: Never Used  . Alcohol Use: No     Colonoscopy: Never  PAP:  Bone density: 2015/osteoporosis  Lipid panel:  Allergies  Allergen Reactions  . Black Pepper [Piper] Swelling    Swelling  . Fish Allergy Anaphylaxis    Allergy to [facility], no issues with shellfish  . Penicillins Swelling  . Blackberry [Rubus Fruticosus] Other (See Comments)    Joint pain  . Cauliflower [Brassica Oleracea Italica] Other (See Comments)    Joint pain  . Codeine Other (See Comments)    Pounding headache  . Gluten Meal   . Latex     Red after band aid is removed  . Milk-Related Compounds Other (See Comments)    Lactose intolerant  . Other Other (See Comments)    MSG (food) causes migraines White potatoes cause joint pain    Current Outpatient Prescriptions  Medication Sig Dispense Refill  . CHROMIUM PO Place 1 tablet under the tongue daily. "CHROMIUM CHLORIDE AX1C13" - vitamin d (special)    . HOMEOPATHIC PRODUCTS SL Place 1  tablet under the tongue daily. "LIMIK"    . HOMEOPATHIC PRODUCTS SL Place 1 tablet under the tongue daily. "AROTA"    . HOMEOPATHIC PRODUCTS SL Place 1 tablet under the tongue daily. "PETULA"    . HOMEOPATHIC PRODUCTS SL Place 1 tablet under the tongue at bedtime. "[place]"    . HOMEOPATHIC PRODUCTS SL Place 1 tablet under the tongue at bedtime. "SCIRRHINUM"    . HOMEOPATHIC PRODUCTS SL Place 1 tablet under the tongue at bedtime. "TARAX-6V"    . HOMEOPATHIC PRODUCTS SL Place 1 tablet under the tongue daily. "CARBON 98BX" - vitamin    . HOMEOPATHIC PRODUCTS SL Place 1 tablet under the tongue daily. "PYRIDOXAL" - vitamin    . NATURE-THROID 65 MG tablet Take 65 mg by mouth daily before breakfast.   1  . Probiotic Product (PROBIOTIC PO) Take 1 tablet by mouth 2 (two) times daily.    Marland Kitchen HOMEOPATHIC PRODUCTS PO Take 1 tablet by mouth as needed (before CT scans).  "ALPHA TOCOPHERD-A3"    . HOMEOPATHIC PRODUCTS SL Place 1 tablet under the tongue daily. "WU JINSON" - vitamin     No current facility-administered medications for this visit.    OBJECTIVE: Older white woman who appears stated age 89 Vitals:   05/24/15 1343  BP: 114/65  Pulse: 75  Temp: 98.1 F (36.7 C)  Resp: 16     Body mass index is 21.3 kg/(m^2).    ECOG FS:0 - Asymptomatic  Sclerae unicteric, EOMs intact Oropharynx clear and moist the right breast is unremarkable. The left breast is status post mastectomy. The incision is healing nicely, with no dehiscence, swelling, or erythema. Left axilla is benign. No cervical or supraclavicular adenopathy Lungs no rales or rhonchi Heart regular rate and rhythm Abd soft, nontender, positive bowel sounds MSK no focal spinal tenderness, no upper extremity lymphedema Neuro: nonfocal, well oriented, appropriate affect Breasts: The right breast is unremarkable. The left breast is status post recent mastectomy. The incision is healing nicely, with no erythema, swelling, or dehiscence. The left axilla is benign.  LAB RESULTS:  CMP     Component Value Date/Time   NA 135 04/14/2015 1546   NA 141 04/12/2015 1616   K 4.0 04/14/2015 1546   K 4.1 04/12/2015 1616   CL 100* 04/14/2015 1546   CO2 26 04/14/2015 1546   CO2 26 04/12/2015 1616   GLUCOSE 98 04/14/2015 1546   GLUCOSE 95 04/12/2015 1616   BUN 12 04/14/2015 1546   BUN 13.8 04/12/2015 1616   CREATININE 1.01* 04/19/2015 1824   CREATININE 0.8 04/12/2015 1616   CALCIUM 9.7 04/14/2015 1546   CALCIUM 9.7 04/12/2015 1616   PROT 7.5 04/12/2015 1616   ALBUMIN 4.0 04/12/2015 1616   AST 41* 04/12/2015 1616   ALT 33 04/12/2015 1616   ALKPHOS 100 04/12/2015 1616   BILITOT 0.43 04/12/2015 1616   GFRNONAA 55* 04/19/2015 1824   GFRAA >60 04/19/2015 1824    INo results found for: SPEP, UPEP  Lab Results  Component Value Date   WBC 6.6 04/19/2015   NEUTROABS 2.3 04/14/2015   HGB 13.4  04/19/2015   HCT 40.4 04/19/2015   MCV 91.6 04/19/2015   PLT 171 04/19/2015      Chemistry      Component Value Date/Time   NA 135 04/14/2015 1546   NA 141 04/12/2015 1616   K 4.0 04/14/2015 1546   K 4.1 04/12/2015 1616   CL 100* 04/14/2015 1546   CO2 26 04/14/2015 1546  CO2 26 04/12/2015 1616   BUN 12 04/14/2015 1546   BUN 13.8 04/12/2015 1616   CREATININE 1.01* 04/19/2015 1824   CREATININE 0.8 04/12/2015 1616      Component Value Date/Time   CALCIUM 9.7 04/14/2015 1546   CALCIUM 9.7 04/12/2015 1616   ALKPHOS 100 04/12/2015 1616   AST 41* 04/12/2015 1616   ALT 33 04/12/2015 1616   BILITOT 0.43 04/12/2015 1616       No results found for: LABCA2  No components found for: LABCA125  No results for input(s): INR in the last 168 hours.  Urinalysis No results found for: COLORURINE, APPEARANCEUR, LABSPEC, PHURINE, GLUCOSEU, HGBUR, BILIRUBINUR, KETONESUR, PROTEINUR, UROBILINOGEN, NITRITE, LEUKOCYTESUR  STUDIES: No results found.  ASSESSMENT: 74 y.o. [place] woman status post left breast biopsy 03/18/2015 for a clinical T2 N0, stage IIA invasive ductal carcinoma, grade 3, estrogen receptor weakly positive, progesterone receptor negative, with an MIB-1 of 90%, and HER-2 not amplified  (1) genetics testing 04/12/15 through the [facility]/ [employer] [facility], [place]) found no deleterious mutations in ATM, BARD1, BRCA1, BRCA2, BRIP1, CDH1, CHEK2, MLH1, MRE11A, MSH2, MSH6, MUTYH, NBN, NF1, PALB2, PMS2, PTEN, RAD50, RAD51C, RAD51D, SMARCA4, STK11, and TP53. Also negative was a deletion/duplication analysis (without sequencing) for 1 gene: EPCAM.   (2) status post left mastectomy and sentinel lymph node sampling for apT2 pN0, stage IIA invasive ductal carcinoma, grade 3, With evidence of lymphovascular invasion, but negative margins. Repeat HER-2 was again negative   PLAN: Megan Case did well with her surgery and certainly looks very hale at present. We spent  approximately 45 minutes discussing standard of care at this point.  Many people would recommend chemotherapy without further ado given the size of her tumor and the fact that it was grade 3 and weakly estrogen receptor positive. She is very doubtful about chemotherapy. We're going to request a Mammaprint to give [place] a more numeric estimate of her risk of this cancer recurring outside her breast in the next several years.  We also discussed radiation. In general for tumors that are less than 5 cm with no lymph node involvement and negative margins we do not recommend radiation. That however assumes that she is receiving some form of systemic treatment. If she decided against systemic treatment then radiation would lower her risk of local recurrence and possibly to some extent of systemic recurrence  Finally we discussed antiestrogen's. She understands the difference between tamoxifen and anastrozole. She knows both of these are inexpensive and effective. They generally reduce the risk of recurrence by one half.  Megan Case listens very carefully and she knows my strong recommendation to her is that she receive systemic therapy. I think it is dangerous for her to avoid these options. I do not believe the non-standard options that she is receiving are safe for her to use as the only systemic treatment.  At this point what Megan Case would like to do is get a more numeric assessment by taking a Mammaprint. Instead of making her a return appointment just to discuss that, we are going to mail the information to her and she will drop by sometime so I can go over it with her to make sure she understands exactly what the tests says.  She knows to call for any problems that may develop before her next visit here.   Chauncey Cruel, MD   05/24/2015 2:05 PM Medical Oncology and Hematology [facility] [address] [place], [place] [address] Tel. [phone]  Fax. [phone]

## 2015-05-25 ENCOUNTER — Encounter: Payer: Self-pay | Admitting: *Deleted

## 2015-05-25 NOTE — Progress Notes (Signed)
Ordered mammoprint per Dr. Jana Hakim order.  Faxed requisition to [facility] to process.  Informed our pathology department as well.

## 2015-05-26 DIAGNOSIS — M542 Cervicalgia: Secondary | ICD-10-CM | POA: Diagnosis not present

## 2015-05-26 DIAGNOSIS — M9901 Segmental and somatic dysfunction of cervical region: Secondary | ICD-10-CM | POA: Diagnosis not present

## 2015-05-27 DIAGNOSIS — C50112 Malignant neoplasm of central portion of left female breast: Secondary | ICD-10-CM | POA: Diagnosis not present

## 2015-06-02 DIAGNOSIS — C50919 Malignant neoplasm of unspecified site of unspecified female breast: Secondary | ICD-10-CM | POA: Diagnosis not present

## 2015-06-03 ENCOUNTER — Encounter: Payer: Self-pay | Admitting: *Deleted

## 2015-06-03 ENCOUNTER — Encounter (HOSPITAL_COMMUNITY): Payer: Self-pay

## 2015-06-03 NOTE — Progress Notes (Unsigned)
Received mammoprint results of High risk.  Copy to Dr. Jana Hakim and copy to medical records to scan.

## 2015-06-09 DIAGNOSIS — M542 Cervicalgia: Secondary | ICD-10-CM | POA: Diagnosis not present

## 2015-06-09 DIAGNOSIS — M9901 Segmental and somatic dysfunction of cervical region: Secondary | ICD-10-CM | POA: Diagnosis not present

## 2015-06-14 ENCOUNTER — Other Ambulatory Visit: Payer: Self-pay | Admitting: Oncology

## 2015-06-14 NOTE — Progress Notes (Unsigned)
I called Megan Case today with the results of her Mammaprint which showed her tumor to be high risk and predicted 5 year risk of recurrence of 22% without systemic treatment, the 10 year risk being 29%. Those risks reduced significantly with systemic treatment.  I offered her a return appointment to discuss that but she tells me she is going to be meeting with Dr. Consuela Mimes August 30 and will discuss it with her at that time. If [name] wishes further discussion she will call us. Otherwise she has no appointment with [place] on until a year from now.

## 2015-06-16 DIAGNOSIS — C50919 Malignant neoplasm of unspecified site of unspecified female breast: Secondary | ICD-10-CM | POA: Diagnosis not present

## 2015-06-18 DIAGNOSIS — R229 Localized swelling, mass and lump, unspecified: Secondary | ICD-10-CM | POA: Diagnosis not present

## 2015-06-19 DIAGNOSIS — C50919 Malignant neoplasm of unspecified site of unspecified female breast: Secondary | ICD-10-CM | POA: Diagnosis not present

## 2015-06-19 DIAGNOSIS — R229 Localized swelling, mass and lump, unspecified: Secondary | ICD-10-CM | POA: Diagnosis not present

## 2015-06-20 DIAGNOSIS — E039 Hypothyroidism, unspecified: Secondary | ICD-10-CM | POA: Diagnosis not present

## 2015-06-20 DIAGNOSIS — R229 Localized swelling, mass and lump, unspecified: Secondary | ICD-10-CM | POA: Diagnosis not present

## 2015-06-20 DIAGNOSIS — R7989 Other specified abnormal findings of blood chemistry: Secondary | ICD-10-CM | POA: Diagnosis not present

## 2015-06-20 DIAGNOSIS — C50919 Malignant neoplasm of unspecified site of unspecified female breast: Secondary | ICD-10-CM | POA: Diagnosis not present

## 2015-06-20 DIAGNOSIS — E559 Vitamin D deficiency, unspecified: Secondary | ICD-10-CM | POA: Diagnosis not present

## 2015-06-24 DIAGNOSIS — M9901 Segmental and somatic dysfunction of cervical region: Secondary | ICD-10-CM | POA: Diagnosis not present

## 2015-06-24 DIAGNOSIS — M542 Cervicalgia: Secondary | ICD-10-CM | POA: Diagnosis not present

## 2015-06-26 DIAGNOSIS — R229 Localized swelling, mass and lump, unspecified: Secondary | ICD-10-CM | POA: Diagnosis not present

## 2015-06-26 DIAGNOSIS — C50919 Malignant neoplasm of unspecified site of unspecified female breast: Secondary | ICD-10-CM | POA: Diagnosis not present

## 2015-06-28 DIAGNOSIS — C50919 Malignant neoplasm of unspecified site of unspecified female breast: Secondary | ICD-10-CM | POA: Diagnosis not present

## 2015-07-06 DIAGNOSIS — M542 Cervicalgia: Secondary | ICD-10-CM | POA: Diagnosis not present

## 2015-07-06 DIAGNOSIS — M9901 Segmental and somatic dysfunction of cervical region: Secondary | ICD-10-CM | POA: Diagnosis not present

## 2015-07-19 DIAGNOSIS — M542 Cervicalgia: Secondary | ICD-10-CM | POA: Diagnosis not present

## 2015-07-19 DIAGNOSIS — M9901 Segmental and somatic dysfunction of cervical region: Secondary | ICD-10-CM | POA: Diagnosis not present

## 2015-07-28 DIAGNOSIS — C50919 Malignant neoplasm of unspecified site of unspecified female breast: Secondary | ICD-10-CM | POA: Diagnosis not present

## 2015-08-04 DIAGNOSIS — M542 Cervicalgia: Secondary | ICD-10-CM | POA: Diagnosis not present

## 2015-08-04 DIAGNOSIS — M9901 Segmental and somatic dysfunction of cervical region: Secondary | ICD-10-CM | POA: Diagnosis not present

## 2015-08-11 DIAGNOSIS — C50112 Malignant neoplasm of central portion of left female breast: Secondary | ICD-10-CM | POA: Diagnosis not present

## 2015-08-11 DIAGNOSIS — Z803 Family history of malignant neoplasm of breast: Secondary | ICD-10-CM | POA: Diagnosis not present

## 2015-08-17 DIAGNOSIS — M542 Cervicalgia: Secondary | ICD-10-CM | POA: Diagnosis not present

## 2015-08-17 DIAGNOSIS — M9901 Segmental and somatic dysfunction of cervical region: Secondary | ICD-10-CM | POA: Diagnosis not present

## 2015-09-01 DIAGNOSIS — M542 Cervicalgia: Secondary | ICD-10-CM | POA: Diagnosis not present

## 2015-09-01 DIAGNOSIS — M9901 Segmental and somatic dysfunction of cervical region: Secondary | ICD-10-CM | POA: Diagnosis not present

## 2015-09-15 DIAGNOSIS — M542 Cervicalgia: Secondary | ICD-10-CM | POA: Diagnosis not present

## 2015-09-15 DIAGNOSIS — M9901 Segmental and somatic dysfunction of cervical region: Secondary | ICD-10-CM | POA: Diagnosis not present

## 2015-09-19 DIAGNOSIS — E039 Hypothyroidism, unspecified: Secondary | ICD-10-CM | POA: Diagnosis not present

## 2015-09-19 DIAGNOSIS — R7989 Other specified abnormal findings of blood chemistry: Secondary | ICD-10-CM | POA: Diagnosis not present

## 2015-09-19 DIAGNOSIS — N951 Menopausal and female climacteric states: Secondary | ICD-10-CM | POA: Diagnosis not present

## 2015-09-19 DIAGNOSIS — E063 Autoimmune thyroiditis: Secondary | ICD-10-CM | POA: Diagnosis not present

## 2015-09-19 DIAGNOSIS — C50919 Malignant neoplasm of unspecified site of unspecified female breast: Secondary | ICD-10-CM | POA: Diagnosis not present

## 2015-09-19 DIAGNOSIS — E559 Vitamin D deficiency, unspecified: Secondary | ICD-10-CM | POA: Diagnosis not present

## 2015-09-19 LAB — HEMOGLOBIN A1C: Hgb A1c MFr Bld: 5.5 % (ref 4.0–6.0)

## 2015-09-19 LAB — LIPID PANEL
Cholesterol: 162 mg/dL (ref 0–200)
HDL: 67 mg/dL (ref 35–70)
LDL Cholesterol: 87 mg/dL
Triglycerides: 51 mg/dL (ref 40–160)

## 2015-09-19 LAB — CBC AND DIFFERENTIAL
HCT: 43 % (ref 36–46)
Hemoglobin: 14.2 g/dL (ref 12.0–16.0)
PLATELETS: 181 10*3/uL (ref 150–399)
WBC: 3 10*3/mL

## 2015-09-19 LAB — HEPATIC FUNCTION PANEL
ALT: 24 U/L (ref 7–35)
AST: 33 U/L (ref 13–35)

## 2015-09-19 LAB — BASIC METABOLIC PANEL
BUN: 16 mg/dL (ref 4–21)
CREATININE: 0.7 mg/dL (ref 0.5–1.1)
GLUCOSE: 78 mg/dL

## 2015-09-20 DIAGNOSIS — M542 Cervicalgia: Secondary | ICD-10-CM | POA: Diagnosis not present

## 2015-09-20 DIAGNOSIS — M9901 Segmental and somatic dysfunction of cervical region: Secondary | ICD-10-CM | POA: Diagnosis not present

## 2015-09-27 ENCOUNTER — Ambulatory Visit (INDEPENDENT_AMBULATORY_CARE_PROVIDER_SITE_OTHER): Payer: Medicare Other | Admitting: Osteopathic Medicine

## 2015-09-27 ENCOUNTER — Encounter: Payer: Self-pay | Admitting: Osteopathic Medicine

## 2015-09-27 VITALS — BP 109/57 | HR 74 | Ht 62.0 in | Wt 117.0 lb

## 2015-09-27 DIAGNOSIS — Z853 Personal history of malignant neoplasm of breast: Secondary | ICD-10-CM | POA: Insufficient documentation

## 2015-09-27 DIAGNOSIS — Z7185 Encounter for immunization safety counseling: Secondary | ICD-10-CM

## 2015-09-27 DIAGNOSIS — Z7189 Other specified counseling: Secondary | ICD-10-CM | POA: Diagnosis not present

## 2015-09-27 DIAGNOSIS — Z13220 Encounter for screening for lipoid disorders: Secondary | ICD-10-CM | POA: Diagnosis not present

## 2015-09-27 DIAGNOSIS — E039 Hypothyroidism, unspecified: Secondary | ICD-10-CM

## 2015-09-27 DIAGNOSIS — Z1211 Encounter for screening for malignant neoplasm of colon: Secondary | ICD-10-CM

## 2015-09-27 DIAGNOSIS — K4090 Unilateral inguinal hernia, without obstruction or gangrene, not specified as recurrent: Secondary | ICD-10-CM

## 2015-09-27 DIAGNOSIS — K469 Unspecified abdominal hernia without obstruction or gangrene: Secondary | ICD-10-CM | POA: Insufficient documentation

## 2015-09-27 DIAGNOSIS — M21619 Bunion of unspecified foot: Secondary | ICD-10-CM | POA: Insufficient documentation

## 2015-09-27 DIAGNOSIS — Z131 Encounter for screening for diabetes mellitus: Secondary | ICD-10-CM | POA: Diagnosis not present

## 2015-09-27 DIAGNOSIS — L84 Corns and callosities: Secondary | ICD-10-CM | POA: Insufficient documentation

## 2015-09-27 NOTE — Patient Instructions (Signed)
If you're interested in seeing one of our sports medicine physicians for discussion of possible orthotics or other treatment of the complaints, please make an appointment with Dr. Georgina Snell or Dr. Darene Lamer. You have been given lab order sheet in case we do not get records from your other other physicians about cholesterol, diabetes screening, thyroid testing you can get these redrawn so that Dr. Sheppard Coil will have the records. With regard to other preventive care, we have discussed the risks versus benefits of recommended vaccinations and other screening tests. We are getting the cologard test which will screen for colon cancer.  Thank you for coming in today, please call the office with any questions or concerns!
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
## 2015-10-03 DIAGNOSIS — M542 Cervicalgia: Secondary | ICD-10-CM | POA: Diagnosis not present

## 2015-10-03 DIAGNOSIS — M9901 Segmental and somatic dysfunction of cervical region: Secondary | ICD-10-CM | POA: Diagnosis not present

## 2015-10-06 DIAGNOSIS — C50919 Malignant neoplasm of unspecified site of unspecified female breast: Secondary | ICD-10-CM | POA: Diagnosis not present

## 2015-10-07 DIAGNOSIS — M9901 Segmental and somatic dysfunction of cervical region: Secondary | ICD-10-CM | POA: Diagnosis not present

## 2015-10-07 DIAGNOSIS — M542 Cervicalgia: Secondary | ICD-10-CM | POA: Diagnosis not present

## 2015-10-10 ENCOUNTER — Encounter: Payer: Self-pay | Admitting: Emergency Medicine

## 2015-10-10 DIAGNOSIS — M542 Cervicalgia: Secondary | ICD-10-CM | POA: Diagnosis not present

## 2015-10-10 DIAGNOSIS — M9901 Segmental and somatic dysfunction of cervical region: Secondary | ICD-10-CM | POA: Diagnosis not present

## 2015-10-10 LAB — VITAMIN D 1,25 DIHYDROXY
EGFR (Non-African Amer.): >89
Vit D, 1,25-Dihydroxy: 73

## 2015-10-18 ENCOUNTER — Other Ambulatory Visit (HOSPITAL_COMMUNITY): Payer: Self-pay | Admitting: Surgery

## 2015-10-18 DIAGNOSIS — M9901 Segmental and somatic dysfunction of cervical region: Secondary | ICD-10-CM | POA: Diagnosis not present

## 2015-10-18 DIAGNOSIS — M542 Cervicalgia: Secondary | ICD-10-CM | POA: Diagnosis not present

## 2015-10-18 DIAGNOSIS — C50919 Malignant neoplasm of unspecified site of unspecified female breast: Secondary | ICD-10-CM

## 2015-10-19 ENCOUNTER — Encounter (HOSPITAL_COMMUNITY)
Admission: RE | Admit: 2015-10-19 | Discharge: 2015-10-19 | Disposition: A | Discharge status: Home / Self Care | Payer: Medicare Other | Source: Ambulatory Visit | Attending: Surgery | Admitting: Surgery

## 2015-10-19 ENCOUNTER — Other Ambulatory Visit (HOSPITAL_COMMUNITY): Payer: Self-pay | Admitting: Surgery

## 2015-10-19 DIAGNOSIS — C799 Secondary malignant neoplasm of unspecified site: Secondary | ICD-10-CM | POA: Diagnosis not present

## 2015-10-19 DIAGNOSIS — C50919 Malignant neoplasm of unspecified site of unspecified female breast: Secondary | ICD-10-CM

## 2015-10-19 MED ORDER — TECHNETIUM TC 99M MEDRONATE IV KIT
26.8000 | PACK | Freq: Once | INTRAVENOUS | Status: AC | PRN
  Administered 2015-10-19: 26.8 via INTRAVENOUS

## 2015-10-21 DIAGNOSIS — M542 Cervicalgia: Secondary | ICD-10-CM | POA: Diagnosis not present

## 2015-10-21 DIAGNOSIS — M9901 Segmental and somatic dysfunction of cervical region: Secondary | ICD-10-CM | POA: Diagnosis not present

## 2015-11-01 DIAGNOSIS — M542 Cervicalgia: Secondary | ICD-10-CM | POA: Diagnosis not present

## 2015-11-01 DIAGNOSIS — M9901 Segmental and somatic dysfunction of cervical region: Secondary | ICD-10-CM | POA: Diagnosis not present

## 2015-11-03 DIAGNOSIS — C50919 Malignant neoplasm of unspecified site of unspecified female breast: Secondary | ICD-10-CM | POA: Diagnosis not present

## 2015-11-15 DIAGNOSIS — Z1211 Encounter for screening for malignant neoplasm of colon: Secondary | ICD-10-CM | POA: Diagnosis not present

## 2015-11-15 DIAGNOSIS — Z1212 Encounter for screening for malignant neoplasm of rectum: Secondary | ICD-10-CM | POA: Diagnosis not present

## 2015-11-15 LAB — COLOGUARD: COLOGUARD: NEGATIVE

## 2015-11-22 DIAGNOSIS — M9901 Segmental and somatic dysfunction of cervical region: Secondary | ICD-10-CM | POA: Diagnosis not present

## 2015-11-22 DIAGNOSIS — M542 Cervicalgia: Secondary | ICD-10-CM | POA: Diagnosis not present

## 2015-11-24 DIAGNOSIS — M542 Cervicalgia: Secondary | ICD-10-CM | POA: Diagnosis not present

## 2015-11-24 DIAGNOSIS — M9901 Segmental and somatic dysfunction of cervical region: Secondary | ICD-10-CM | POA: Diagnosis not present

## 2015-12-01 DIAGNOSIS — C50919 Malignant neoplasm of unspecified site of unspecified female breast: Secondary | ICD-10-CM | POA: Diagnosis not present

## 2015-12-14 DIAGNOSIS — M9901 Segmental and somatic dysfunction of cervical region: Secondary | ICD-10-CM | POA: Diagnosis not present

## 2015-12-14 DIAGNOSIS — M542 Cervicalgia: Secondary | ICD-10-CM | POA: Diagnosis not present

## 2015-12-20 DIAGNOSIS — M9901 Segmental and somatic dysfunction of cervical region: Secondary | ICD-10-CM | POA: Diagnosis not present

## 2015-12-20 DIAGNOSIS — M542 Cervicalgia: Secondary | ICD-10-CM | POA: Diagnosis not present

## 2015-12-27 DIAGNOSIS — E559 Vitamin D deficiency, unspecified: Secondary | ICD-10-CM | POA: Diagnosis not present

## 2015-12-27 DIAGNOSIS — C50919 Malignant neoplasm of unspecified site of unspecified female breast: Secondary | ICD-10-CM | POA: Diagnosis not present

## 2015-12-27 DIAGNOSIS — N951 Menopausal and female climacteric states: Secondary | ICD-10-CM | POA: Diagnosis not present

## 2015-12-27 DIAGNOSIS — R7989 Other specified abnormal findings of blood chemistry: Secondary | ICD-10-CM | POA: Diagnosis not present

## 2015-12-27 DIAGNOSIS — E039 Hypothyroidism, unspecified: Secondary | ICD-10-CM | POA: Diagnosis not present

## 2016-01-02 DIAGNOSIS — M542 Cervicalgia: Secondary | ICD-10-CM | POA: Diagnosis not present

## 2016-01-02 DIAGNOSIS — M9901 Segmental and somatic dysfunction of cervical region: Secondary | ICD-10-CM | POA: Diagnosis not present

## 2016-01-03 DIAGNOSIS — C50919 Malignant neoplasm of unspecified site of unspecified female breast: Secondary | ICD-10-CM | POA: Diagnosis not present

## 2016-01-13 DIAGNOSIS — M9901 Segmental and somatic dysfunction of cervical region: Secondary | ICD-10-CM | POA: Diagnosis not present

## 2016-01-13 DIAGNOSIS — M542 Cervicalgia: Secondary | ICD-10-CM | POA: Diagnosis not present

## 2016-01-25 DIAGNOSIS — M9901 Segmental and somatic dysfunction of cervical region: Secondary | ICD-10-CM | POA: Diagnosis not present

## 2016-01-25 DIAGNOSIS — M542 Cervicalgia: Secondary | ICD-10-CM | POA: Diagnosis not present

## 2016-02-07 DIAGNOSIS — C50919 Malignant neoplasm of unspecified site of unspecified female breast: Secondary | ICD-10-CM | POA: Diagnosis not present

## 2016-02-15 DIAGNOSIS — M9901 Segmental and somatic dysfunction of cervical region: Secondary | ICD-10-CM | POA: Diagnosis not present

## 2016-02-15 DIAGNOSIS — M542 Cervicalgia: Secondary | ICD-10-CM | POA: Diagnosis not present

## 2016-02-23 DIAGNOSIS — C50919 Malignant neoplasm of unspecified site of unspecified female breast: Secondary | ICD-10-CM | POA: Diagnosis not present

## 2016-03-01 DIAGNOSIS — M542 Cervicalgia: Secondary | ICD-10-CM | POA: Diagnosis not present

## 2016-03-01 DIAGNOSIS — M9901 Segmental and somatic dysfunction of cervical region: Secondary | ICD-10-CM | POA: Diagnosis not present

## 2016-03-20 DIAGNOSIS — M542 Cervicalgia: Secondary | ICD-10-CM | POA: Diagnosis not present

## 2016-03-20 DIAGNOSIS — M9901 Segmental and somatic dysfunction of cervical region: Secondary | ICD-10-CM | POA: Diagnosis not present

## 2016-03-27 DIAGNOSIS — M9901 Segmental and somatic dysfunction of cervical region: Secondary | ICD-10-CM | POA: Diagnosis not present

## 2016-03-27 DIAGNOSIS — M542 Cervicalgia: Secondary | ICD-10-CM | POA: Diagnosis not present

## 2016-04-03 ENCOUNTER — Other Ambulatory Visit: Payer: Self-pay | Admitting: General Surgery

## 2016-04-03 DIAGNOSIS — E559 Vitamin D deficiency, unspecified: Secondary | ICD-10-CM | POA: Diagnosis not present

## 2016-04-03 DIAGNOSIS — N951 Menopausal and female climacteric states: Secondary | ICD-10-CM | POA: Diagnosis not present

## 2016-04-03 DIAGNOSIS — Z853 Personal history of malignant neoplasm of breast: Secondary | ICD-10-CM

## 2016-04-03 DIAGNOSIS — R7989 Other specified abnormal findings of blood chemistry: Secondary | ICD-10-CM | POA: Diagnosis not present

## 2016-04-03 DIAGNOSIS — C50919 Malignant neoplasm of unspecified site of unspecified female breast: Secondary | ICD-10-CM | POA: Diagnosis not present

## 2016-04-03 DIAGNOSIS — E039 Hypothyroidism, unspecified: Secondary | ICD-10-CM | POA: Diagnosis not present

## 2016-04-06 ENCOUNTER — Ambulatory Visit
Admission: RE | Admit: 2016-04-06 | Discharge: 2016-04-06 | Disposition: A | Discharge status: Home / Self Care | Payer: Medicare Other | Source: Ambulatory Visit | Attending: General Surgery | Admitting: General Surgery

## 2016-04-06 ENCOUNTER — Other Ambulatory Visit: Payer: Self-pay | Admitting: General Surgery

## 2016-04-06 DIAGNOSIS — Z1231 Encounter for screening mammogram for malignant neoplasm of breast: Secondary | ICD-10-CM

## 2016-04-06 DIAGNOSIS — Z853 Personal history of malignant neoplasm of breast: Secondary | ICD-10-CM

## 2016-04-12 DIAGNOSIS — Z803 Family history of malignant neoplasm of breast: Secondary | ICD-10-CM | POA: Diagnosis not present

## 2016-04-12 DIAGNOSIS — C50112 Malignant neoplasm of central portion of left female breast: Secondary | ICD-10-CM | POA: Diagnosis not present

## 2016-04-12 DIAGNOSIS — Z9012 Acquired absence of left breast and nipple: Secondary | ICD-10-CM | POA: Diagnosis not present

## 2016-04-13 ENCOUNTER — Other Ambulatory Visit: Payer: Self-pay | Admitting: Oncology

## 2016-04-13 DIAGNOSIS — M9901 Segmental and somatic dysfunction of cervical region: Secondary | ICD-10-CM | POA: Diagnosis not present

## 2016-04-13 DIAGNOSIS — M542 Cervicalgia: Secondary | ICD-10-CM | POA: Diagnosis not present

## 2016-04-23 DIAGNOSIS — C50919 Malignant neoplasm of unspecified site of unspecified female breast: Secondary | ICD-10-CM | POA: Diagnosis not present

## 2016-04-25 DIAGNOSIS — M542 Cervicalgia: Secondary | ICD-10-CM | POA: Diagnosis not present

## 2016-04-25 DIAGNOSIS — M9901 Segmental and somatic dysfunction of cervical region: Secondary | ICD-10-CM | POA: Diagnosis not present

## 2016-05-14 DIAGNOSIS — M9901 Segmental and somatic dysfunction of cervical region: Secondary | ICD-10-CM | POA: Diagnosis not present

## 2016-05-14 DIAGNOSIS — M542 Cervicalgia: Secondary | ICD-10-CM | POA: Diagnosis not present

## 2016-05-22 ENCOUNTER — Ambulatory Visit: Payer: Medicare Other | Admitting: Oncology

## 2016-05-22 DIAGNOSIS — M9901 Segmental and somatic dysfunction of cervical region: Secondary | ICD-10-CM | POA: Diagnosis not present

## 2016-05-22 DIAGNOSIS — M542 Cervicalgia: Secondary | ICD-10-CM | POA: Diagnosis not present

## 2016-05-31 DIAGNOSIS — M9901 Segmental and somatic dysfunction of cervical region: Secondary | ICD-10-CM | POA: Diagnosis not present

## 2016-05-31 DIAGNOSIS — M542 Cervicalgia: Secondary | ICD-10-CM | POA: Diagnosis not present

## 2016-06-19 DIAGNOSIS — E039 Hypothyroidism, unspecified: Secondary | ICD-10-CM | POA: Diagnosis not present

## 2016-06-19 DIAGNOSIS — C50919 Malignant neoplasm of unspecified site of unspecified female breast: Secondary | ICD-10-CM | POA: Diagnosis not present

## 2016-06-19 DIAGNOSIS — E559 Vitamin D deficiency, unspecified: Secondary | ICD-10-CM | POA: Diagnosis not present

## 2016-06-19 DIAGNOSIS — N951 Menopausal and female climacteric states: Secondary | ICD-10-CM | POA: Diagnosis not present

## 2016-06-19 DIAGNOSIS — R7989 Other specified abnormal findings of blood chemistry: Secondary | ICD-10-CM | POA: Diagnosis not present

## 2016-06-21 DIAGNOSIS — M9901 Segmental and somatic dysfunction of cervical region: Secondary | ICD-10-CM | POA: Diagnosis not present

## 2016-06-21 DIAGNOSIS — M542 Cervicalgia: Secondary | ICD-10-CM | POA: Diagnosis not present

## 2016-06-28 DIAGNOSIS — M542 Cervicalgia: Secondary | ICD-10-CM | POA: Diagnosis not present

## 2016-06-28 DIAGNOSIS — M9901 Segmental and somatic dysfunction of cervical region: Secondary | ICD-10-CM | POA: Diagnosis not present

## 2016-06-29 IMAGING — MG MM DIGITAL DIAGNOSTIC UNILAT*L*
2 series · 2 of 2 positions shown · non-contrast
Comparison: Previous exam(s).

CLINICAL DATA: Post clip mammograms following ultrasound-guided
core needle biopsy of a large left breast mass.

EXAM:
DIAGNOSTIC LEFT MAMMOGRAM POST ULTRASOUND BIOPSY

[L ML]
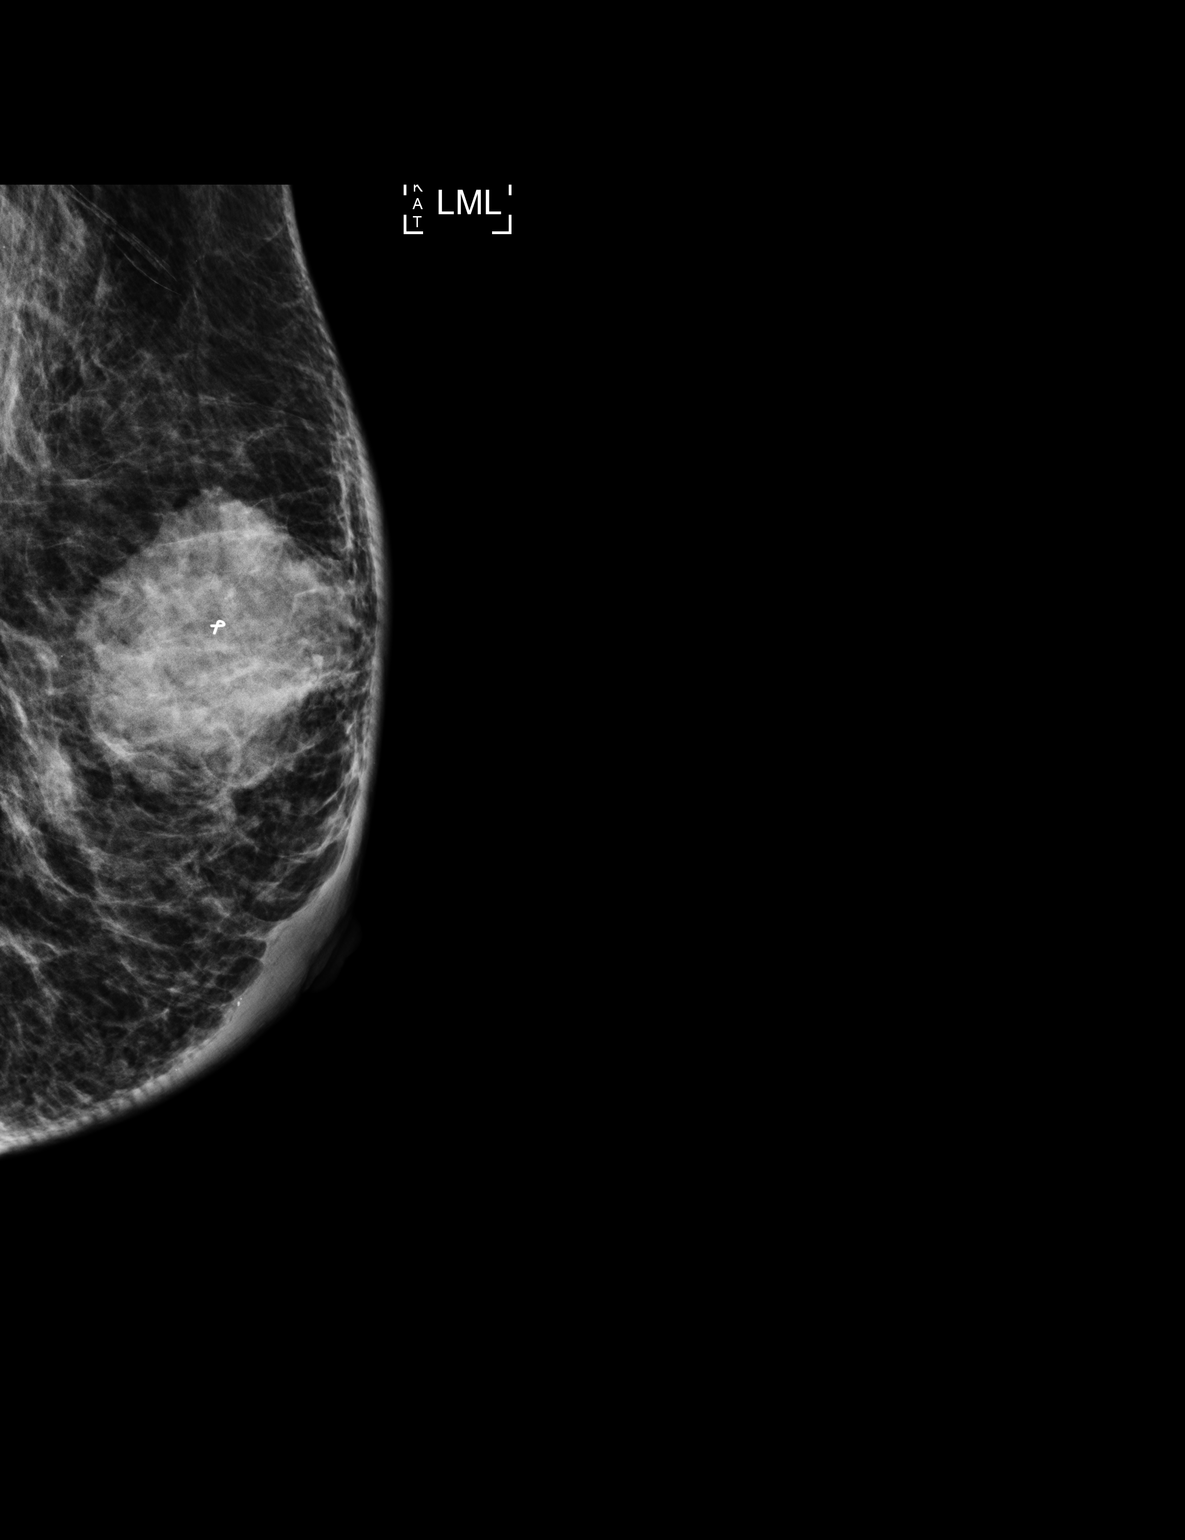

[L CC]
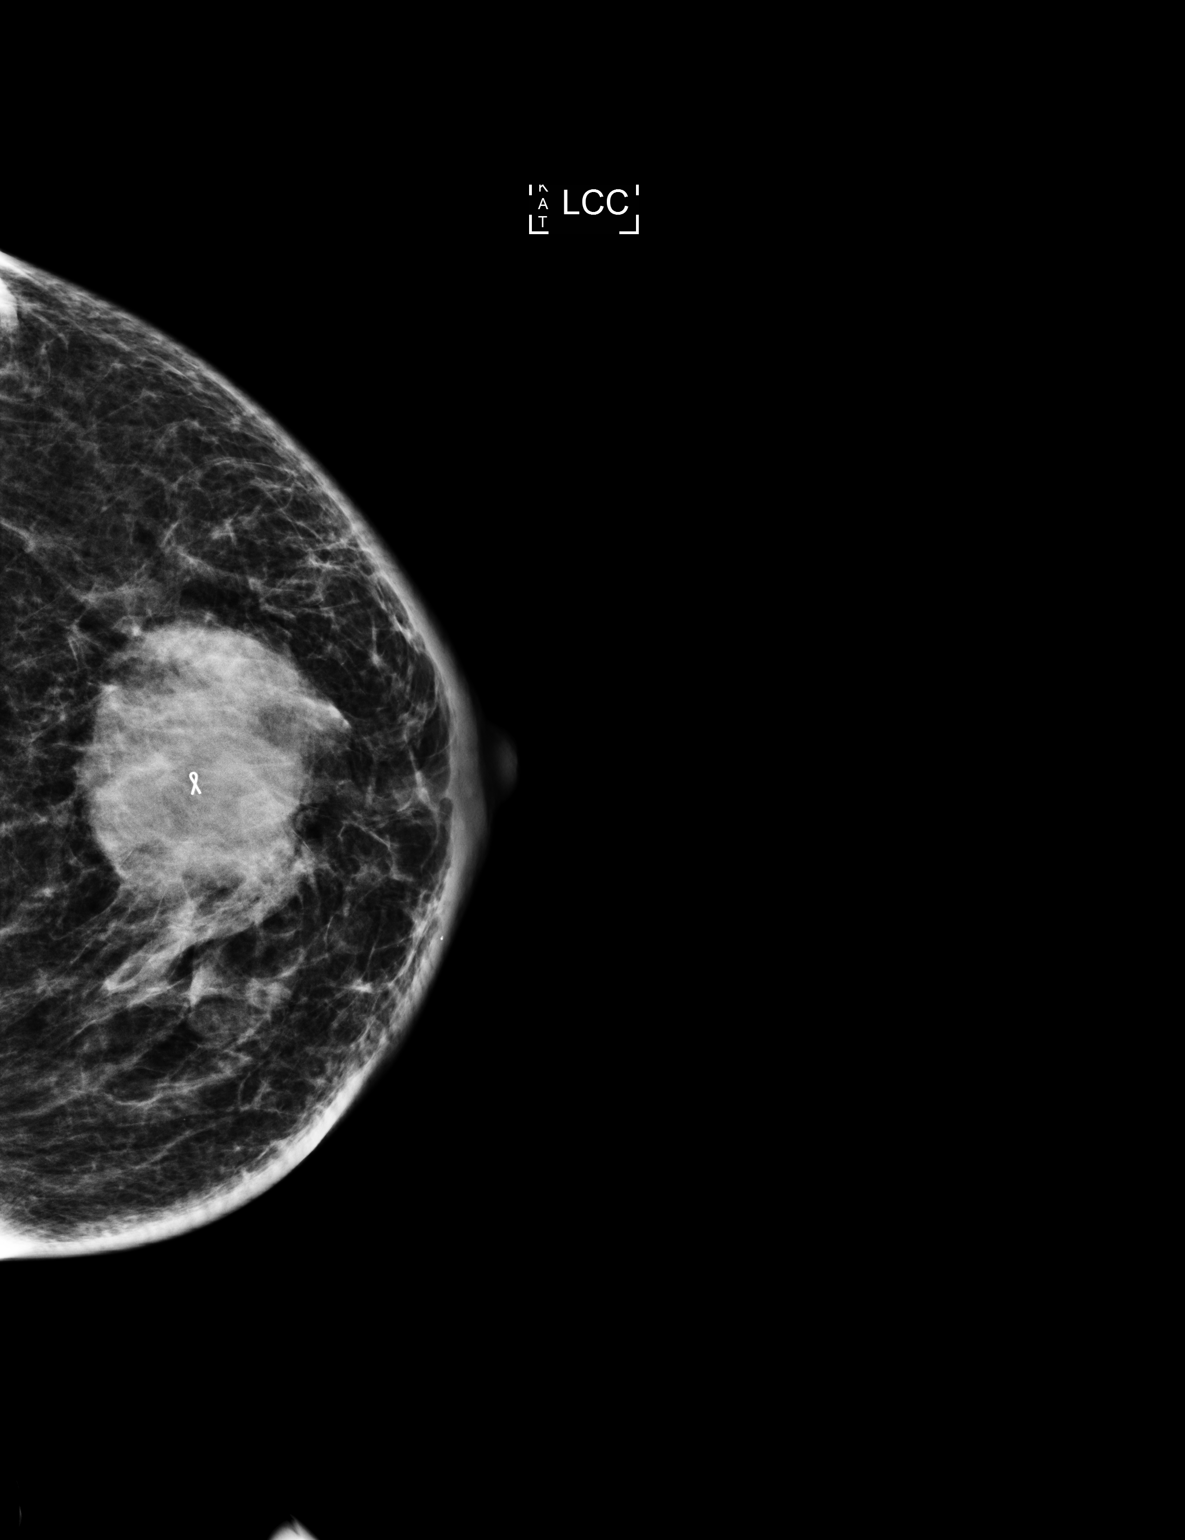

[2 of 2 positions shown; findings below may reference images not displayed]

FINDINGS: Mammographic images were obtained following ultrasound guided biopsy
of a large left breast mass. The ribbon shaped biopsy clip lies
within central aspect of the mass.
IMPRESSION: Well-positioned ribbon shaped biopsy clip following
ultrasound-guided core needle biopsy of a left breast mass.

Final Assessment: Post Procedure Mammograms for Marker Placement

## 2016-06-29 IMAGING — US US BREAST LTD UNI LEFT INC AXILLA
1 series · 11 of 11 positions shown · non-contrast
Comparison: None.

CLINICAL DATA: Patient presents with a large left breast mass
bulging the overlying skin.

EXAM:
DIGITAL DIAGNOSTIC BILATERAL MAMMOGRAM WITH 3D TOMOSYNTHESIS WITH
CAD
ULTRASOUND LEFT BREAST

[Series 1: us breast ltd uni left inc axilla · 0.07mm/px · 11 of 11 slices shown]
[im 1/11]
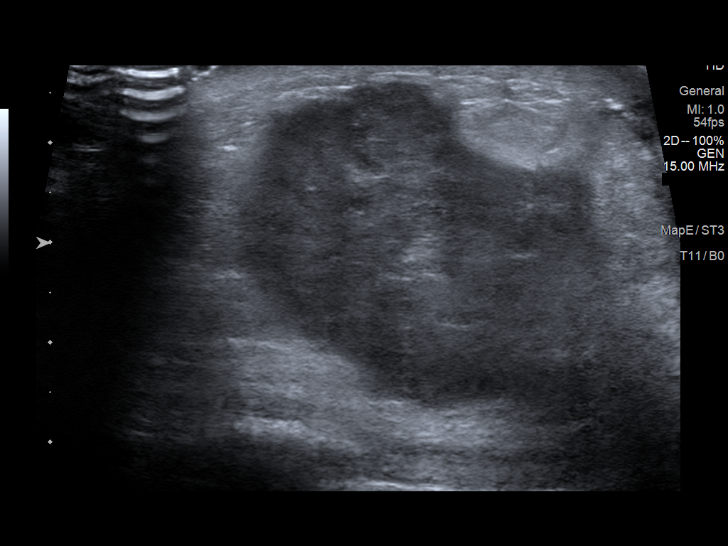
[im 2/11]
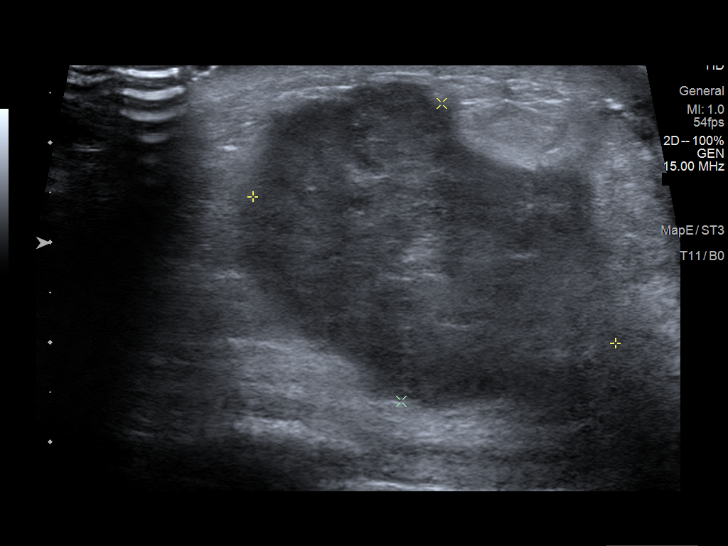
[im 3/11]
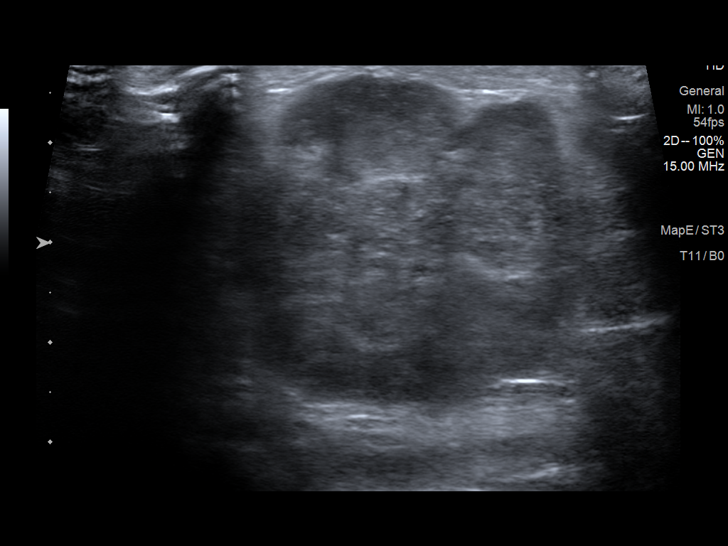
[im 4/11]
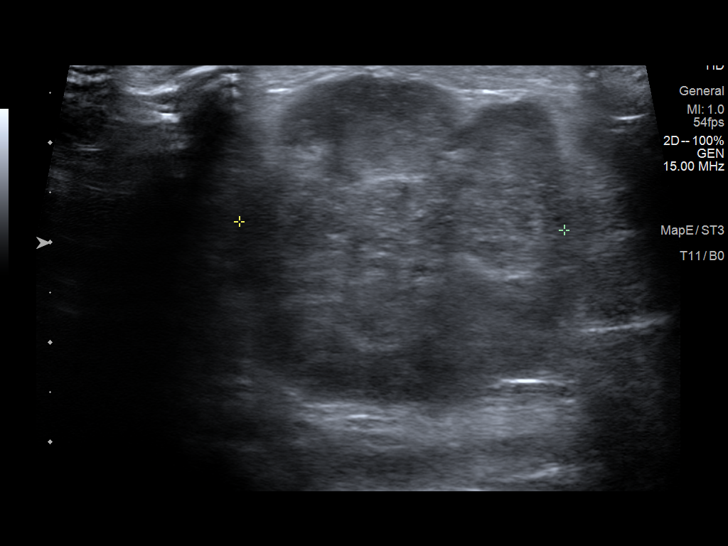
[im 5/11]
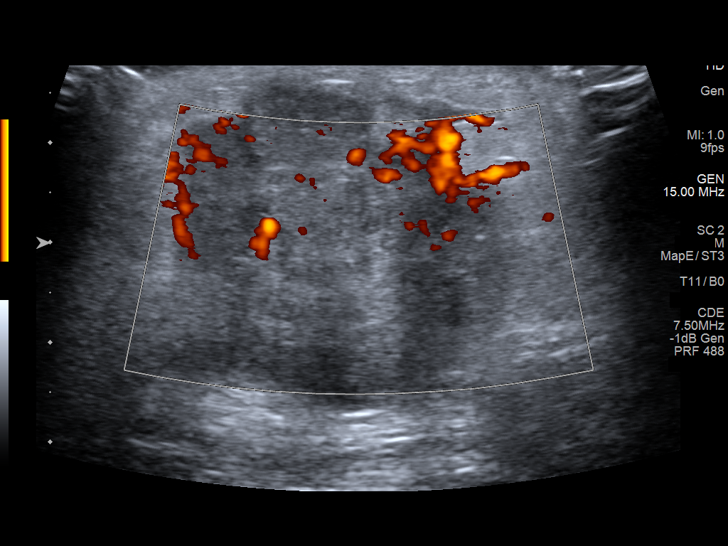
[im 6/11]
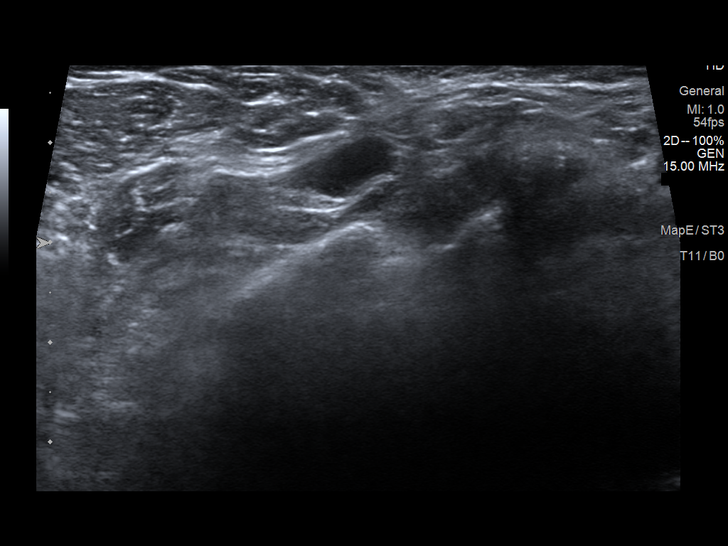
[im 7/11]
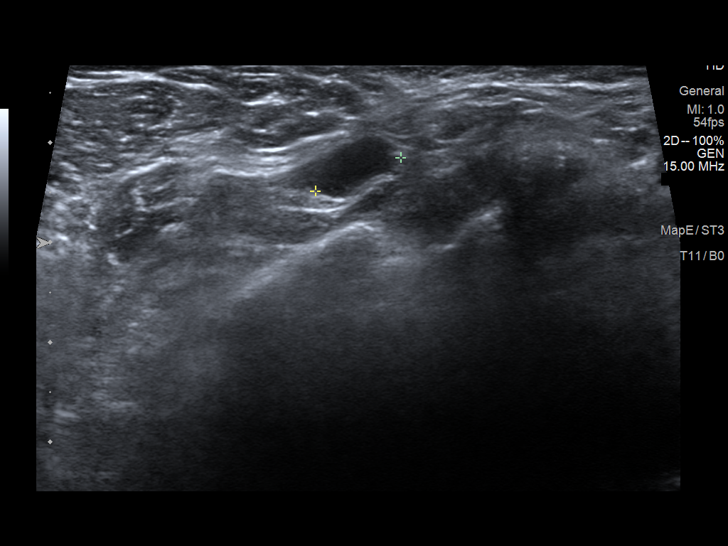
[im 8/11]
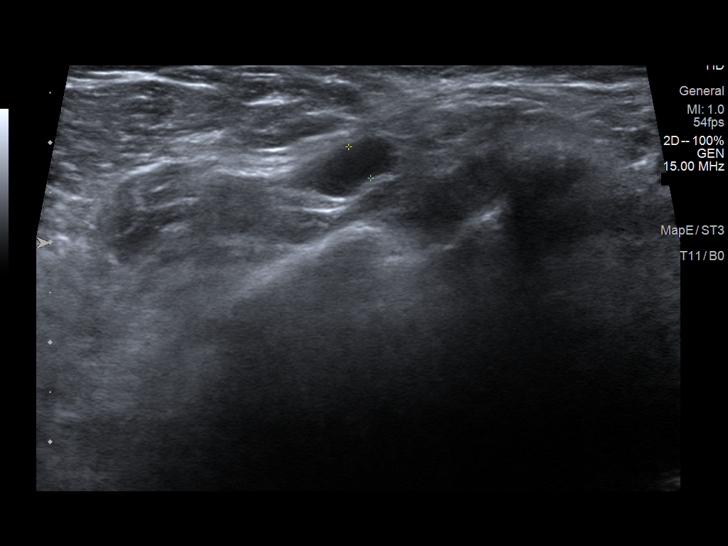
[im 9/11]
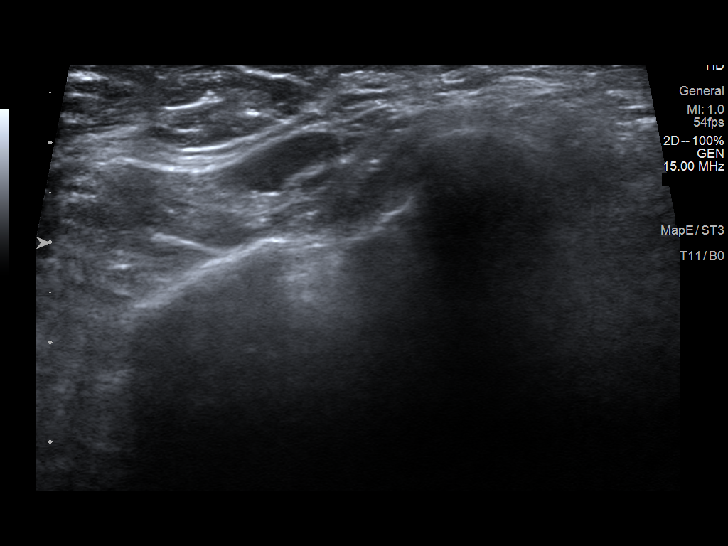
[im 10/11]
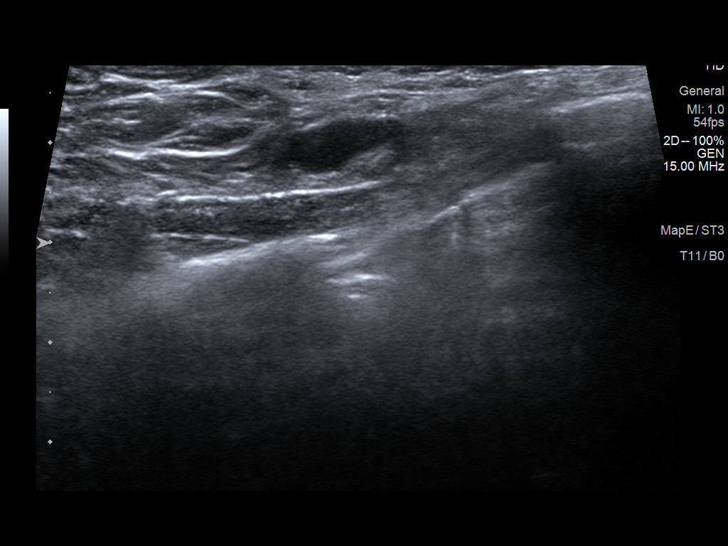
[im 11/11]
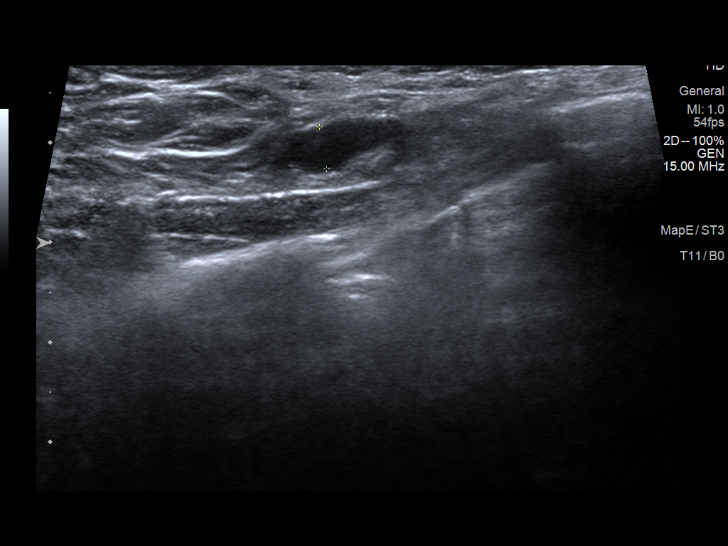

[11 of 11 positions shown; findings below may reference images not displayed]

ACR Breast Density Category b: There are scattered areas of
fibroglandular density.
FINDINGS: There is a large lobulated mass in the 12 o'clock position of the
left breast, mid depth. No other breast masses. There is some
architectural distortion associated with the large mass on the left.
No other distortion. There is generalized thickening of the
suspensory ligaments on the left as well as overlying skin
thickening. No abnormality on the right. There are no suspicious
calcifications.

Mammographic images were processed with CAD.

On physical exam, there is a large firm mass bulging the skin of the
upper left breast.

Targeted ultrasound is performed, showing a large solid
heterogeneous mass with significant internal blood flow and
lobulated partly indistinct margins in the 12 o'clock position of
the left breast, 3 cm the nipple. It measures 3.9 cm x 3 cm x 3.3 cm
in size.

In the left axilla there is a single prominent lymph node with a
thickened cortex measuring 4 mm. Node itself is sub cm in short axis
measuring 9 mm. No other prominent or abnormal lymph nodes.
IMPRESSION: Highly suspicious mass in the left breast for a breast malignancy.
Possible left axillary metastatic adenopathy to a single visualized
node. Biopsy is recommended.

RECOMMENDATION:
Biopsy of both the left breast mass and the single abnormal lymph
node in the left axilla.

I have discussed the findings and recommendations with the patient.
Results were also provided in writing at the conclusion of the
visit. If applicable, a reminder letter will be sent to the patient
regarding the next appointment.

BI-RADS CATEGORY  5: Highly suggestive of malignancy.

## 2016-06-29 IMAGING — US US BREAST BX W LOC DEV 1ST LESION IMG BX SPEC US GUIDE*L*
1 series · 13 of 21 positions shown · non-contrast
Comparison: Previous exam(s).

ADDENDUM:
Pathology revealed grade III invasive ductal carcinoma in the left
breast and a benign partially sampled left axillary lymph node. This
was found to be concordant by Dr. Jaishaan Churistuthehas. Pathology was
discussed with the patient by telephone. She reported doing well
after the biopsy with tenderness and some initial fullness under the
left axilla. Post biopsy instructions and care were reviewed and her
questions were answered. Surgical consultation has been scheduled
with Dr. Manmohan Square at [REDACTED] on
March 31, 2015. The patient was encouraged to come to The [REDACTED] for educational materials. My number
was provided for future questions and concerns.

Pathology results reported by Mariamma How RN, BSN on March 22, 2015.
CLINICAL DATA: Patient presents for ultrasound-guided core needle
biopsy of a large left breast mass and a prominent left axillary
lymph node.
EXAM:
ULTRASOUND GUIDED LEFT BREAST CORE NEEDLE BIOPSY
ULTRASOUND-GUIDED LEFT AXILLARY LYMPH NODE CORE NEEDLE BIOPSY

[Series 1: us breast bx w loc dev 1st lesion img bx spec us g · 0.07mm/px · 13 of 21 slices shown]
[im 1/21]
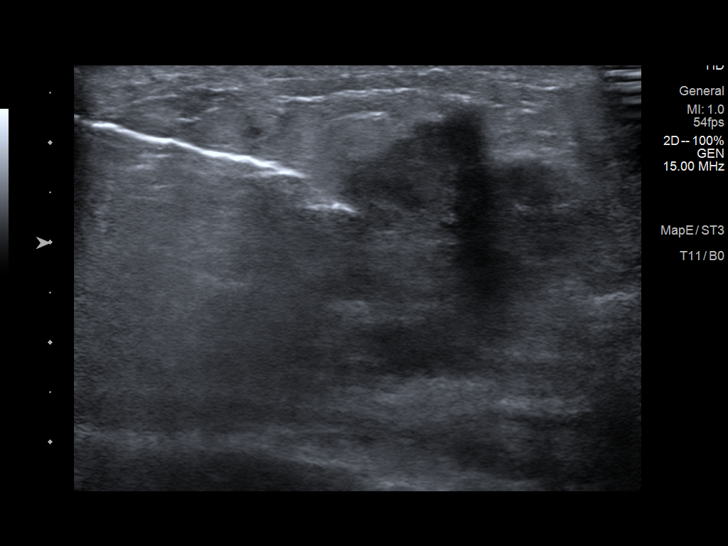
[im 3/21]
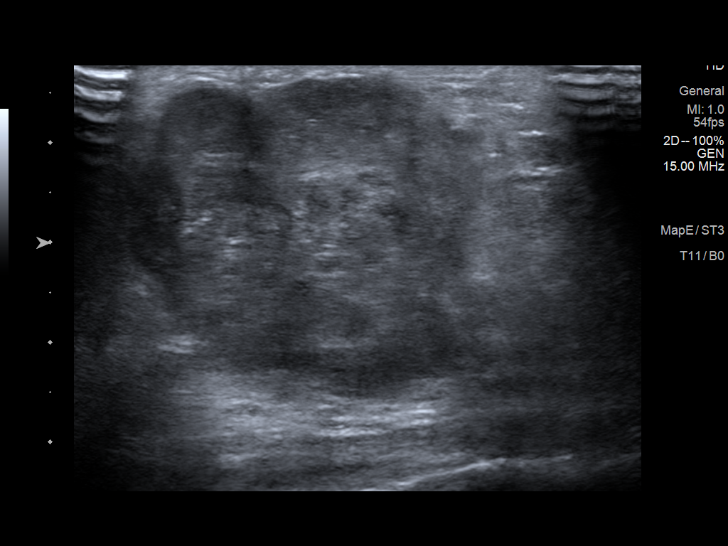
[im 5/21]
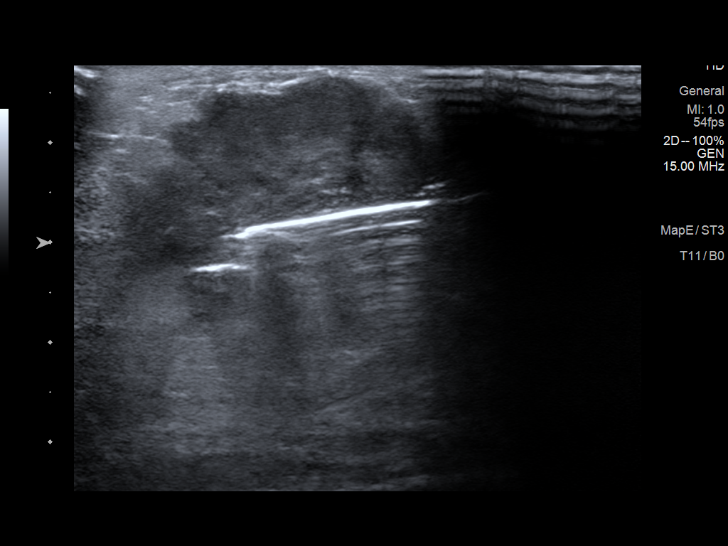
[im 6/21]
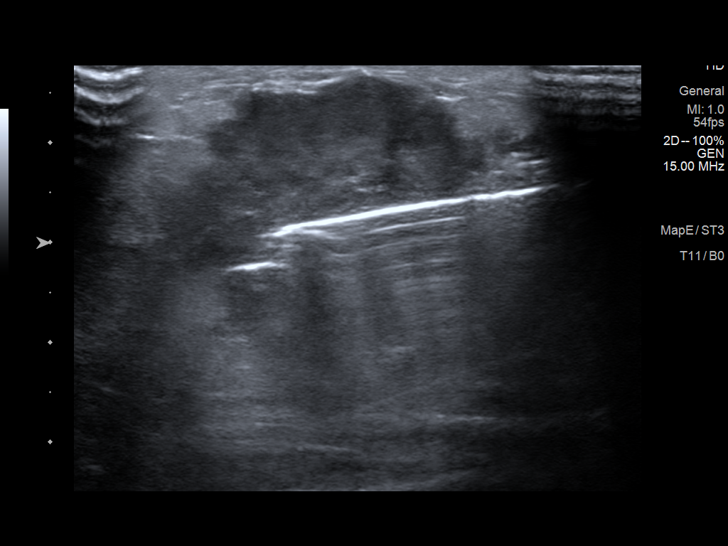
[im 8/21]
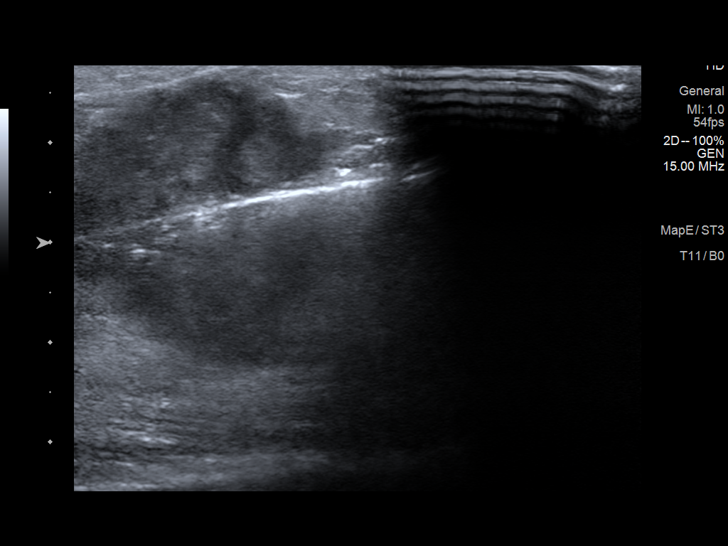
[im 9/21]
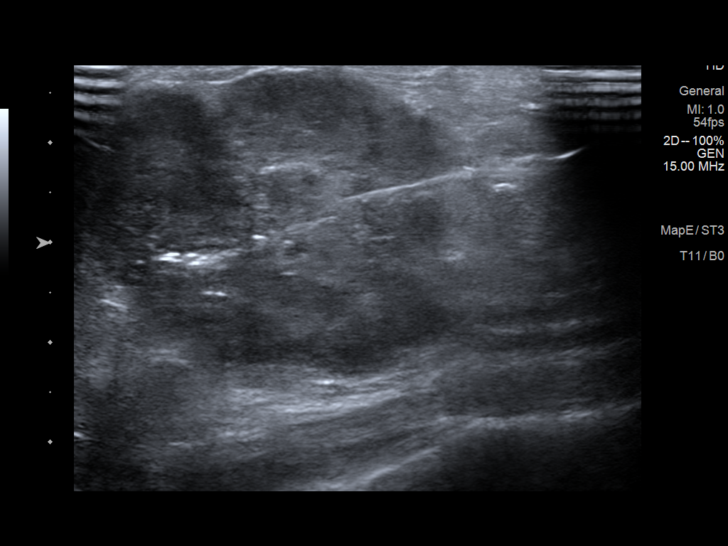
[im 11/21]
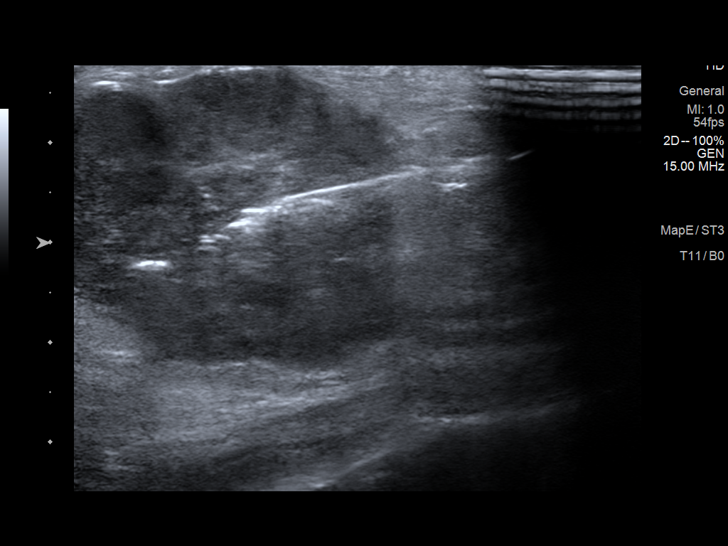
[im 13/21]
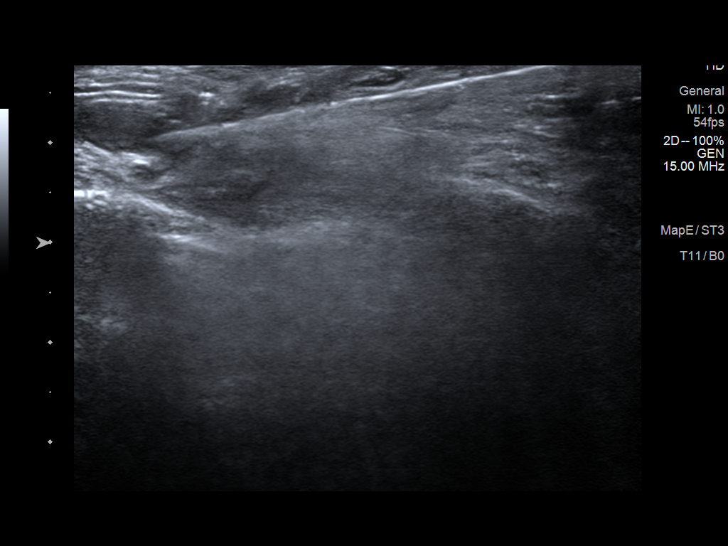
[im 14/21]
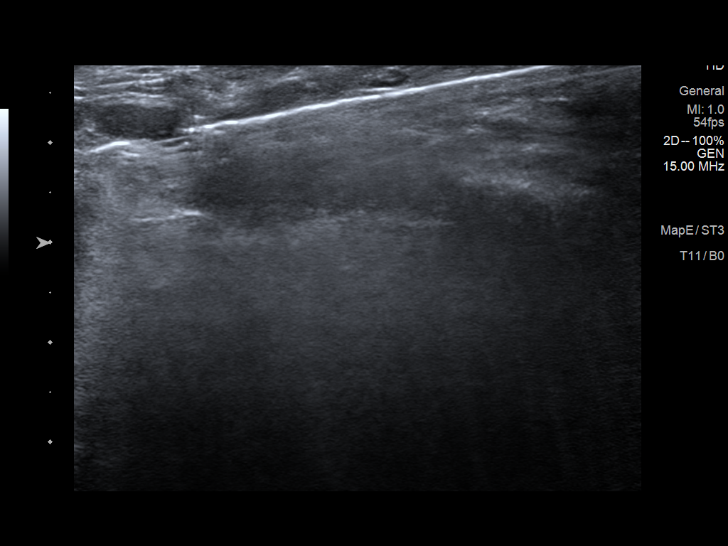
[im 16/21]
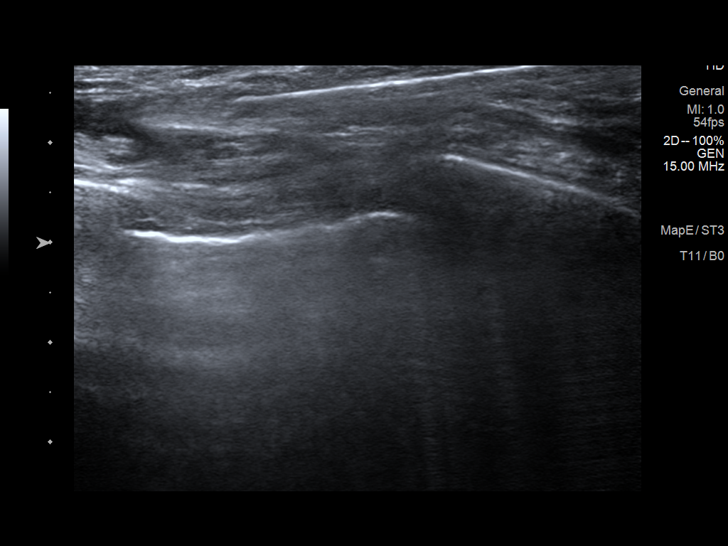
[im 17/21]
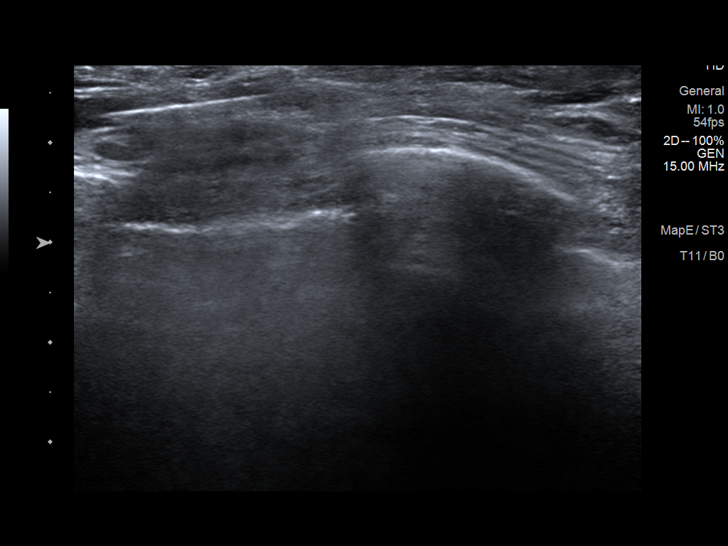
[im 19/21]
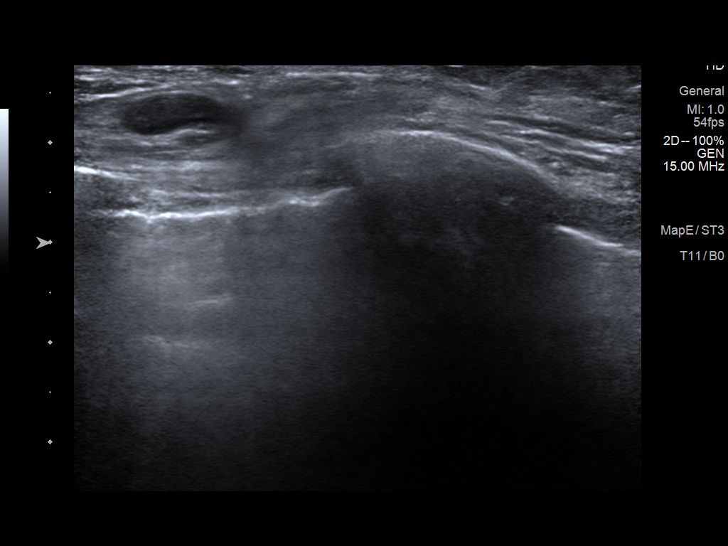
[im 21/21]
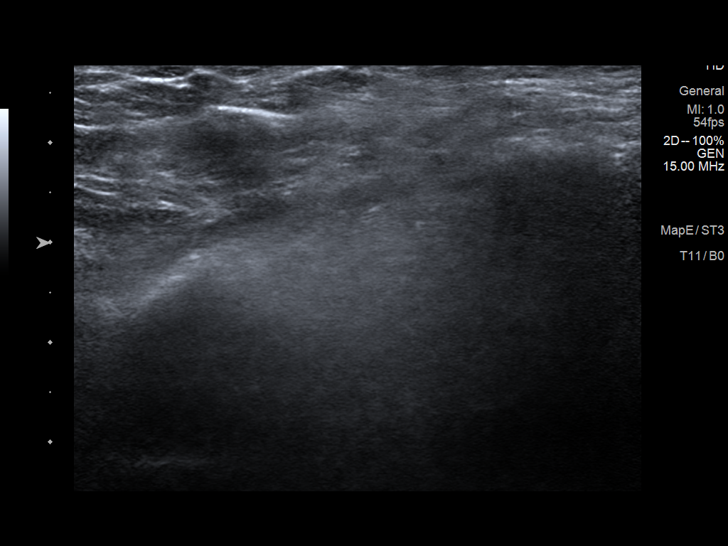

[13 of 21 positions shown; findings below may reference images not displayed]



Using sterile technique and 2% Lidocaine as local anesthetic, under
direct ultrasound visualization, a 12 gauge Kunihisa device was
used to perform biopsy of a large left breast mass using a medial
approach. At the conclusion of the procedure a ribbon shaped tissue
marker clip was deployed into the biopsy cavity. Follow up 2 view
mammogram was performed and dictated separately.

Using sterile technique and 2% Lidocaine as local anesthetic, under
direct ultrasound visualization, a 14 gauge Kunihisa device was
used to perform biopsy of a prominent left axillary lymph node using
an inferior approach. At the conclusion of the procedure a HydroMARK
tissue marker clip was deployed into the biopsy cavity.
IMPRESSION: Ultrasound guided biopsy of a left breast mass and a prominent left
axillary lymph node. No apparent complications.

## 2016-07-10 DIAGNOSIS — C50919 Malignant neoplasm of unspecified site of unspecified female breast: Secondary | ICD-10-CM | POA: Diagnosis not present

## 2016-07-18 DIAGNOSIS — M9901 Segmental and somatic dysfunction of cervical region: Secondary | ICD-10-CM | POA: Diagnosis not present

## 2016-07-18 DIAGNOSIS — M542 Cervicalgia: Secondary | ICD-10-CM | POA: Diagnosis not present

## 2016-07-30 DIAGNOSIS — M9901 Segmental and somatic dysfunction of cervical region: Secondary | ICD-10-CM | POA: Diagnosis not present

## 2016-07-30 DIAGNOSIS — M542 Cervicalgia: Secondary | ICD-10-CM | POA: Diagnosis not present

## 2016-08-05 DIAGNOSIS — C50919 Malignant neoplasm of unspecified site of unspecified female breast: Secondary | ICD-10-CM | POA: Diagnosis not present

## 2016-08-07 DIAGNOSIS — E039 Hypothyroidism, unspecified: Secondary | ICD-10-CM | POA: Diagnosis not present

## 2016-08-07 DIAGNOSIS — E559 Vitamin D deficiency, unspecified: Secondary | ICD-10-CM | POA: Diagnosis not present

## 2016-08-07 DIAGNOSIS — N951 Menopausal and female climacteric states: Secondary | ICD-10-CM | POA: Diagnosis not present

## 2016-08-07 DIAGNOSIS — R7989 Other specified abnormal findings of blood chemistry: Secondary | ICD-10-CM | POA: Diagnosis not present

## 2016-08-07 DIAGNOSIS — C50919 Malignant neoplasm of unspecified site of unspecified female breast: Secondary | ICD-10-CM | POA: Diagnosis not present

## 2016-08-09 DIAGNOSIS — M9901 Segmental and somatic dysfunction of cervical region: Secondary | ICD-10-CM | POA: Diagnosis not present

## 2016-08-09 DIAGNOSIS — M542 Cervicalgia: Secondary | ICD-10-CM | POA: Diagnosis not present

## 2016-08-15 DIAGNOSIS — C50919 Malignant neoplasm of unspecified site of unspecified female breast: Secondary | ICD-10-CM | POA: Diagnosis not present

## 2016-08-17 DIAGNOSIS — M542 Cervicalgia: Secondary | ICD-10-CM | POA: Diagnosis not present

## 2016-08-17 DIAGNOSIS — M9901 Segmental and somatic dysfunction of cervical region: Secondary | ICD-10-CM | POA: Diagnosis not present

## 2016-08-23 DIAGNOSIS — M9901 Segmental and somatic dysfunction of cervical region: Secondary | ICD-10-CM | POA: Diagnosis not present

## 2016-08-23 DIAGNOSIS — M542 Cervicalgia: Secondary | ICD-10-CM | POA: Diagnosis not present

## 2016-08-31 DIAGNOSIS — M542 Cervicalgia: Secondary | ICD-10-CM | POA: Diagnosis not present

## 2016-08-31 DIAGNOSIS — M9901 Segmental and somatic dysfunction of cervical region: Secondary | ICD-10-CM | POA: Diagnosis not present

## 2016-09-10 DIAGNOSIS — M9901 Segmental and somatic dysfunction of cervical region: Secondary | ICD-10-CM | POA: Diagnosis not present

## 2016-09-10 DIAGNOSIS — M542 Cervicalgia: Secondary | ICD-10-CM | POA: Diagnosis not present

## 2016-09-13 DIAGNOSIS — C50919 Malignant neoplasm of unspecified site of unspecified female breast: Secondary | ICD-10-CM | POA: Diagnosis not present

## 2016-09-28 ENCOUNTER — Other Ambulatory Visit (HOSPITAL_COMMUNITY): Payer: Self-pay | Admitting: Surgery

## 2016-09-28 DIAGNOSIS — C50919 Malignant neoplasm of unspecified site of unspecified female breast: Secondary | ICD-10-CM

## 2016-10-09 DIAGNOSIS — E039 Hypothyroidism, unspecified: Secondary | ICD-10-CM | POA: Diagnosis not present

## 2016-10-09 DIAGNOSIS — E559 Vitamin D deficiency, unspecified: Secondary | ICD-10-CM | POA: Diagnosis not present

## 2016-10-09 DIAGNOSIS — R7989 Other specified abnormal findings of blood chemistry: Secondary | ICD-10-CM | POA: Diagnosis not present

## 2016-10-09 DIAGNOSIS — C50919 Malignant neoplasm of unspecified site of unspecified female breast: Secondary | ICD-10-CM | POA: Diagnosis not present

## 2016-10-11 ENCOUNTER — Encounter (HOSPITAL_COMMUNITY)
Admission: RE | Admit: 2016-10-11 | Discharge: 2016-10-11 | Disposition: A | Discharge status: Home / Self Care | Payer: Medicare Other | Source: Ambulatory Visit | Attending: Surgery | Admitting: Surgery

## 2016-10-11 DIAGNOSIS — C50912 Malignant neoplasm of unspecified site of left female breast: Secondary | ICD-10-CM | POA: Diagnosis not present

## 2016-10-11 DIAGNOSIS — C50919 Malignant neoplasm of unspecified site of unspecified female breast: Secondary | ICD-10-CM | POA: Diagnosis not present

## 2016-10-11 LAB — GLUCOSE, CAPILLARY: Glucose-Capillary: 88 mg/dL (ref 65–99)

## 2016-10-11 MED ORDER — FLUDEOXYGLUCOSE F - 18 (FDG) INJECTION: 5.8000 | Freq: Once | INTRAVENOUS | Status: DC | PRN

## 2016-10-15 DIAGNOSIS — C50919 Malignant neoplasm of unspecified site of unspecified female breast: Secondary | ICD-10-CM | POA: Diagnosis not present

## 2016-11-22 DIAGNOSIS — C7800 Secondary malignant neoplasm of unspecified lung: Secondary | ICD-10-CM | POA: Diagnosis not present

## 2016-11-22 DIAGNOSIS — C787 Secondary malignant neoplasm of liver and intrahepatic bile duct: Secondary | ICD-10-CM | POA: Diagnosis not present

## 2016-11-22 DIAGNOSIS — C7951 Secondary malignant neoplasm of bone: Secondary | ICD-10-CM | POA: Diagnosis not present

## 2016-11-22 DIAGNOSIS — C50919 Malignant neoplasm of unspecified site of unspecified female breast: Secondary | ICD-10-CM | POA: Diagnosis not present

## 2016-11-23 DIAGNOSIS — C50919 Malignant neoplasm of unspecified site of unspecified female breast: Secondary | ICD-10-CM | POA: Diagnosis not present

## 2016-11-23 DIAGNOSIS — C7800 Secondary malignant neoplasm of unspecified lung: Secondary | ICD-10-CM | POA: Diagnosis not present

## 2016-11-23 DIAGNOSIS — C7951 Secondary malignant neoplasm of bone: Secondary | ICD-10-CM | POA: Diagnosis not present

## 2016-11-23 DIAGNOSIS — C787 Secondary malignant neoplasm of liver and intrahepatic bile duct: Secondary | ICD-10-CM | POA: Diagnosis not present

## 2016-11-24 DIAGNOSIS — C787 Secondary malignant neoplasm of liver and intrahepatic bile duct: Secondary | ICD-10-CM | POA: Diagnosis not present

## 2016-11-24 DIAGNOSIS — C7800 Secondary malignant neoplasm of unspecified lung: Secondary | ICD-10-CM | POA: Diagnosis not present

## 2016-11-24 DIAGNOSIS — C7951 Secondary malignant neoplasm of bone: Secondary | ICD-10-CM | POA: Diagnosis not present

## 2016-11-24 DIAGNOSIS — C50919 Malignant neoplasm of unspecified site of unspecified female breast: Secondary | ICD-10-CM | POA: Diagnosis not present

## 2016-11-25 DIAGNOSIS — C50919 Malignant neoplasm of unspecified site of unspecified female breast: Secondary | ICD-10-CM | POA: Diagnosis not present

## 2016-11-25 DIAGNOSIS — C787 Secondary malignant neoplasm of liver and intrahepatic bile duct: Secondary | ICD-10-CM | POA: Diagnosis not present

## 2016-11-25 DIAGNOSIS — C7951 Secondary malignant neoplasm of bone: Secondary | ICD-10-CM | POA: Diagnosis not present

## 2016-11-25 DIAGNOSIS — C7800 Secondary malignant neoplasm of unspecified lung: Secondary | ICD-10-CM | POA: Diagnosis not present

## 2016-11-26 DIAGNOSIS — C7800 Secondary malignant neoplasm of unspecified lung: Secondary | ICD-10-CM | POA: Diagnosis not present

## 2016-11-26 DIAGNOSIS — C7951 Secondary malignant neoplasm of bone: Secondary | ICD-10-CM | POA: Diagnosis not present

## 2016-11-26 DIAGNOSIS — C50919 Malignant neoplasm of unspecified site of unspecified female breast: Secondary | ICD-10-CM | POA: Diagnosis not present

## 2016-11-26 DIAGNOSIS — C787 Secondary malignant neoplasm of liver and intrahepatic bile duct: Secondary | ICD-10-CM | POA: Diagnosis not present

## 2016-11-27 DIAGNOSIS — C50919 Malignant neoplasm of unspecified site of unspecified female breast: Secondary | ICD-10-CM | POA: Diagnosis not present

## 2016-11-27 DIAGNOSIS — C7800 Secondary malignant neoplasm of unspecified lung: Secondary | ICD-10-CM | POA: Diagnosis not present

## 2016-11-27 DIAGNOSIS — C7951 Secondary malignant neoplasm of bone: Secondary | ICD-10-CM | POA: Diagnosis not present

## 2016-11-27 DIAGNOSIS — C787 Secondary malignant neoplasm of liver and intrahepatic bile duct: Secondary | ICD-10-CM | POA: Diagnosis not present

## 2016-11-28 DIAGNOSIS — C7951 Secondary malignant neoplasm of bone: Secondary | ICD-10-CM | POA: Diagnosis not present

## 2016-11-28 DIAGNOSIS — C787 Secondary malignant neoplasm of liver and intrahepatic bile duct: Secondary | ICD-10-CM | POA: Diagnosis not present

## 2016-11-28 DIAGNOSIS — C7800 Secondary malignant neoplasm of unspecified lung: Secondary | ICD-10-CM | POA: Diagnosis not present

## 2016-11-28 DIAGNOSIS — C50919 Malignant neoplasm of unspecified site of unspecified female breast: Secondary | ICD-10-CM | POA: Diagnosis not present

## 2016-11-29 DIAGNOSIS — C7951 Secondary malignant neoplasm of bone: Secondary | ICD-10-CM | POA: Diagnosis not present

## 2016-11-29 DIAGNOSIS — C787 Secondary malignant neoplasm of liver and intrahepatic bile duct: Secondary | ICD-10-CM | POA: Diagnosis not present

## 2016-11-29 DIAGNOSIS — C50919 Malignant neoplasm of unspecified site of unspecified female breast: Secondary | ICD-10-CM | POA: Diagnosis not present

## 2016-11-29 DIAGNOSIS — C7800 Secondary malignant neoplasm of unspecified lung: Secondary | ICD-10-CM | POA: Diagnosis not present

## 2016-11-30 DIAGNOSIS — C787 Secondary malignant neoplasm of liver and intrahepatic bile duct: Secondary | ICD-10-CM | POA: Diagnosis not present

## 2016-11-30 DIAGNOSIS — C7951 Secondary malignant neoplasm of bone: Secondary | ICD-10-CM | POA: Diagnosis not present

## 2016-11-30 DIAGNOSIS — C7800 Secondary malignant neoplasm of unspecified lung: Secondary | ICD-10-CM | POA: Diagnosis not present

## 2016-11-30 DIAGNOSIS — C50919 Malignant neoplasm of unspecified site of unspecified female breast: Secondary | ICD-10-CM | POA: Diagnosis not present

## 2016-12-01 DIAGNOSIS — C7951 Secondary malignant neoplasm of bone: Secondary | ICD-10-CM | POA: Diagnosis not present

## 2016-12-01 DIAGNOSIS — C787 Secondary malignant neoplasm of liver and intrahepatic bile duct: Secondary | ICD-10-CM | POA: Diagnosis not present

## 2016-12-01 DIAGNOSIS — C50919 Malignant neoplasm of unspecified site of unspecified female breast: Secondary | ICD-10-CM | POA: Diagnosis not present

## 2016-12-01 DIAGNOSIS — C7800 Secondary malignant neoplasm of unspecified lung: Secondary | ICD-10-CM | POA: Diagnosis not present

## 2016-12-03 DIAGNOSIS — C7800 Secondary malignant neoplasm of unspecified lung: Secondary | ICD-10-CM | POA: Diagnosis not present

## 2016-12-03 DIAGNOSIS — C7951 Secondary malignant neoplasm of bone: Secondary | ICD-10-CM | POA: Diagnosis not present

## 2016-12-03 DIAGNOSIS — C787 Secondary malignant neoplasm of liver and intrahepatic bile duct: Secondary | ICD-10-CM | POA: Diagnosis not present

## 2016-12-03 DIAGNOSIS — C50919 Malignant neoplasm of unspecified site of unspecified female breast: Secondary | ICD-10-CM | POA: Diagnosis not present

## 2016-12-04 DIAGNOSIS — C7800 Secondary malignant neoplasm of unspecified lung: Secondary | ICD-10-CM | POA: Diagnosis not present

## 2016-12-04 DIAGNOSIS — C50919 Malignant neoplasm of unspecified site of unspecified female breast: Secondary | ICD-10-CM | POA: Diagnosis not present

## 2016-12-04 DIAGNOSIS — C787 Secondary malignant neoplasm of liver and intrahepatic bile duct: Secondary | ICD-10-CM | POA: Diagnosis not present

## 2016-12-04 DIAGNOSIS — C7951 Secondary malignant neoplasm of bone: Secondary | ICD-10-CM | POA: Diagnosis not present

## 2016-12-05 DIAGNOSIS — C7800 Secondary malignant neoplasm of unspecified lung: Secondary | ICD-10-CM | POA: Diagnosis not present

## 2016-12-05 DIAGNOSIS — C50919 Malignant neoplasm of unspecified site of unspecified female breast: Secondary | ICD-10-CM | POA: Diagnosis not present

## 2016-12-05 DIAGNOSIS — C7951 Secondary malignant neoplasm of bone: Secondary | ICD-10-CM | POA: Diagnosis not present

## 2016-12-05 DIAGNOSIS — C787 Secondary malignant neoplasm of liver and intrahepatic bile duct: Secondary | ICD-10-CM | POA: Diagnosis not present

## 2016-12-07 DIAGNOSIS — C7951 Secondary malignant neoplasm of bone: Secondary | ICD-10-CM | POA: Diagnosis not present

## 2016-12-07 DIAGNOSIS — C50919 Malignant neoplasm of unspecified site of unspecified female breast: Secondary | ICD-10-CM | POA: Diagnosis not present

## 2016-12-07 DIAGNOSIS — C7800 Secondary malignant neoplasm of unspecified lung: Secondary | ICD-10-CM | POA: Diagnosis not present

## 2016-12-07 DIAGNOSIS — C787 Secondary malignant neoplasm of liver and intrahepatic bile duct: Secondary | ICD-10-CM | POA: Diagnosis not present

## 2016-12-08 DIAGNOSIS — C787 Secondary malignant neoplasm of liver and intrahepatic bile duct: Secondary | ICD-10-CM | POA: Diagnosis not present

## 2016-12-08 DIAGNOSIS — C7800 Secondary malignant neoplasm of unspecified lung: Secondary | ICD-10-CM | POA: Diagnosis not present

## 2016-12-08 DIAGNOSIS — C7951 Secondary malignant neoplasm of bone: Secondary | ICD-10-CM | POA: Diagnosis not present

## 2016-12-08 DIAGNOSIS — C50919 Malignant neoplasm of unspecified site of unspecified female breast: Secondary | ICD-10-CM | POA: Diagnosis not present

## 2016-12-09 DIAGNOSIS — C787 Secondary malignant neoplasm of liver and intrahepatic bile duct: Secondary | ICD-10-CM | POA: Diagnosis not present

## 2016-12-09 DIAGNOSIS — C7951 Secondary malignant neoplasm of bone: Secondary | ICD-10-CM | POA: Diagnosis not present

## 2016-12-09 DIAGNOSIS — C7800 Secondary malignant neoplasm of unspecified lung: Secondary | ICD-10-CM | POA: Diagnosis not present

## 2016-12-09 DIAGNOSIS — C50919 Malignant neoplasm of unspecified site of unspecified female breast: Secondary | ICD-10-CM | POA: Diagnosis not present

## 2016-12-10 DIAGNOSIS — C7800 Secondary malignant neoplasm of unspecified lung: Secondary | ICD-10-CM | POA: Diagnosis not present

## 2016-12-10 DIAGNOSIS — C50919 Malignant neoplasm of unspecified site of unspecified female breast: Secondary | ICD-10-CM | POA: Diagnosis not present

## 2016-12-10 DIAGNOSIS — C7951 Secondary malignant neoplasm of bone: Secondary | ICD-10-CM | POA: Diagnosis not present

## 2016-12-10 DIAGNOSIS — C787 Secondary malignant neoplasm of liver and intrahepatic bile duct: Secondary | ICD-10-CM | POA: Diagnosis not present

## 2016-12-27 DEATH — deceased

## 2018-01-23 IMAGING — CT NM PET TUM IMG RESTAG (PS) SKULL BASE T - THIGH
7 series · 25 of 25 positions shown · non-contrast
Comparison: 04/15/2015

CLINICAL DATA: Subsequent treatment strategy for left breast
cancer.

EXAM:
NUCLEAR MEDICINE PET SKULL BASE TO THIGH
TECHNIQUE: 5.8 mCi F-18 FDG was injected intravenously. Full-ring PET imaging
was performed from the skull base to thigh after the radiotracer. CT
data was obtained and used for attenuation correction and anatomic
localization.
FASTING BLOOD GLUCOSE:  Value: 88 mg/dl

[Series 3: pet sk_thigh ac · axial · 5.0mm · 4.07mm/px · z∈[-856,-40]mm · 5 of 205 slices shown]
[im 1/205]
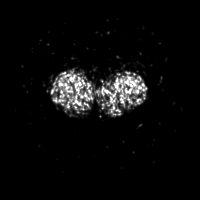
[im 52/205]
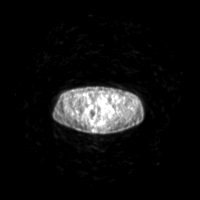
[im 103/205]
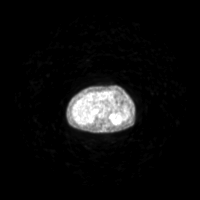
[im 154/205]
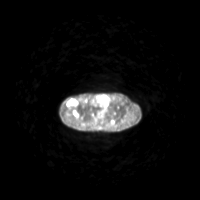
[im 205/205]
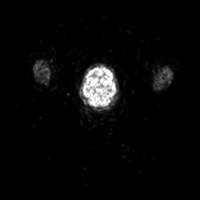

[Series 4: ct sk_thigh 5.0 b31f · axial · 5.0mm · 0.98mm/px · z∈[-856,-40]mm · 5 of 205 slices shown]
[im 1/205]
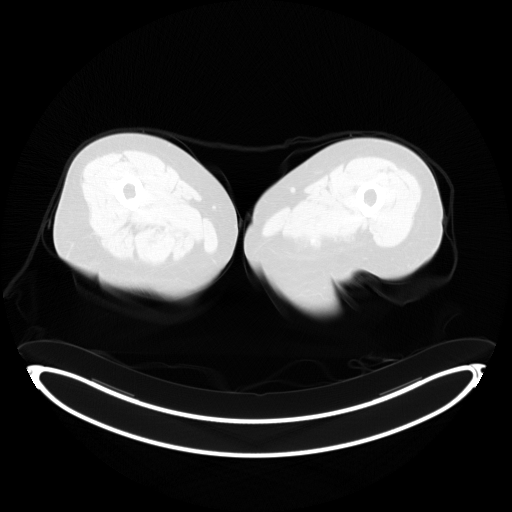
[im 52/205]
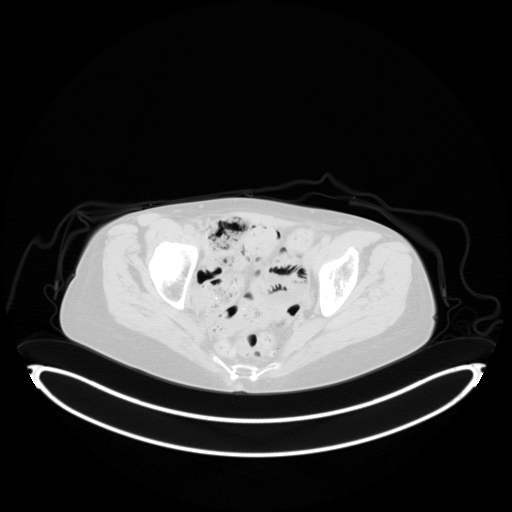
[im 103/205]
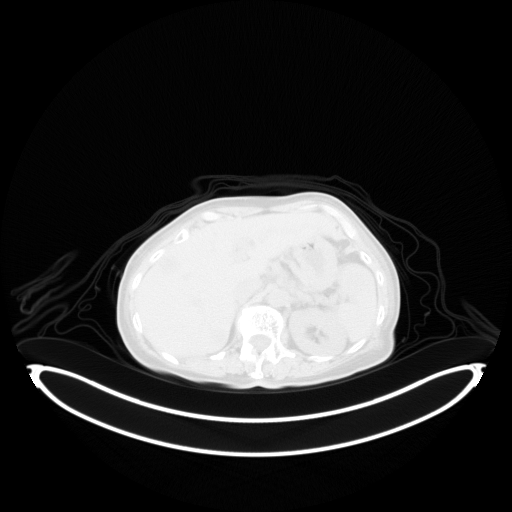
[im 154/205]
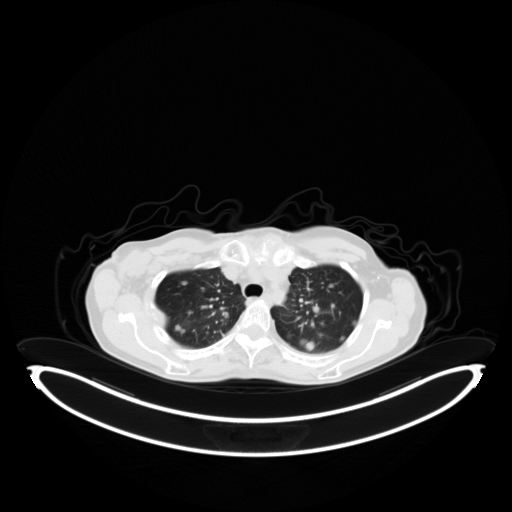
[im 205/205  brain]
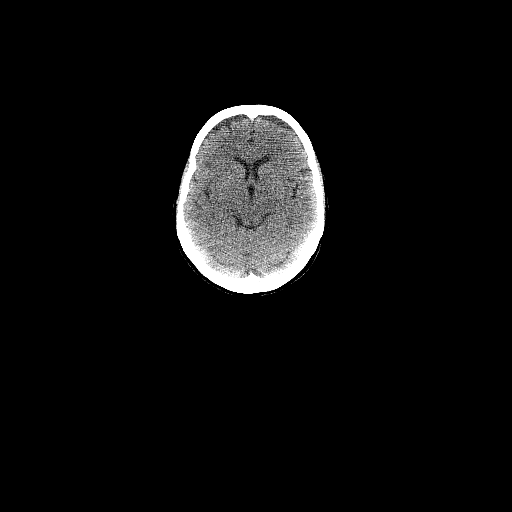

[Series 7: pet sk_thigh nac · axial · 5.0mm · 4.07mm/px · z∈[-856,-40]mm · 5 of 205 slices shown]
[im 1/205]
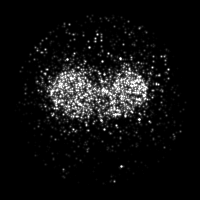
[im 52/205]
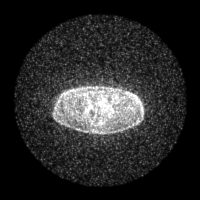
[im 103/205]
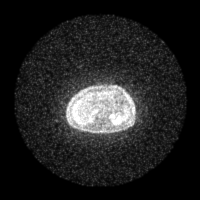
[im 154/205]
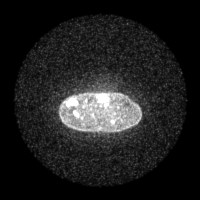
[im 205/205]
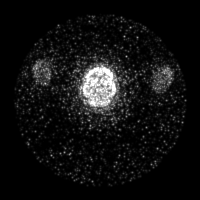

[Series 8: ct sk_thigh 5.0 b70f (id)_bone · axial · 5.0mm · 0.65mm/px · z∈[-432,-172]mm · 2 of 66 slices shown]
[im 1/66  bone]
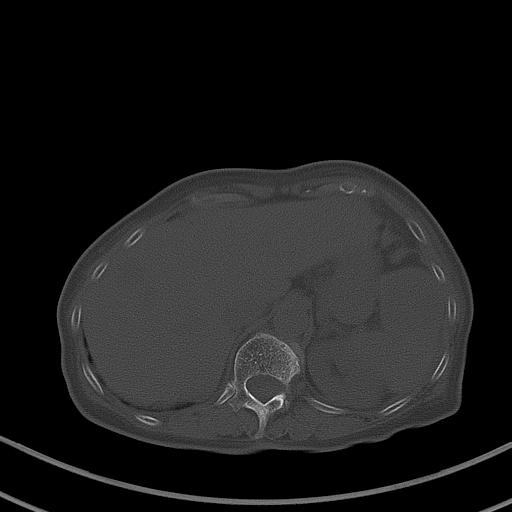
[im 66/66  bone]
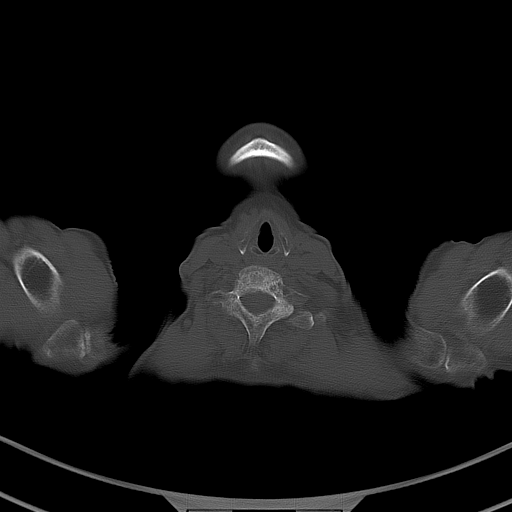

[Series 604: range-ct sk_thigh 5.0 (id)<alpha range> · 2 of 68 slices shown (1 of 2)]
[im 1/68]
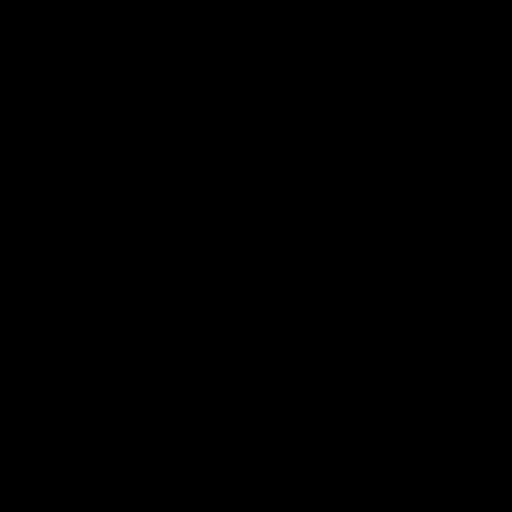
[im 68/68]
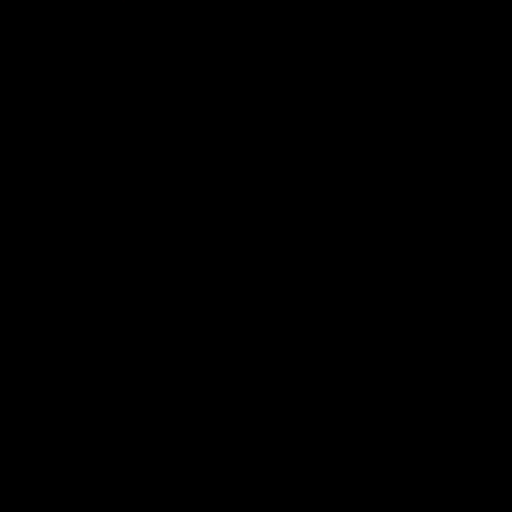

[Series 605: range-ct sk_thigh 5.0 (id)<alpha range> · 5 of 196 slices shown (2 of 2)]
[im 1/196]
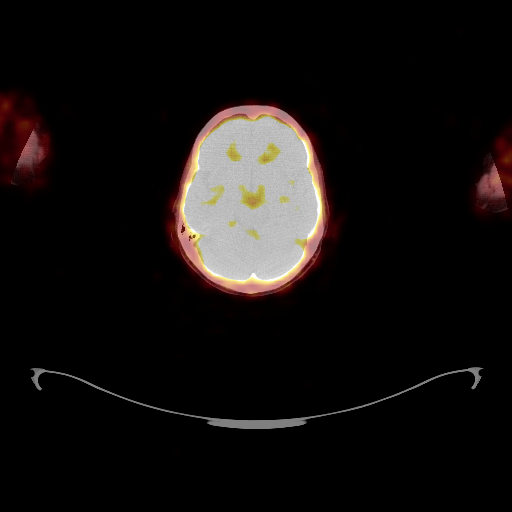
[im 49/196]
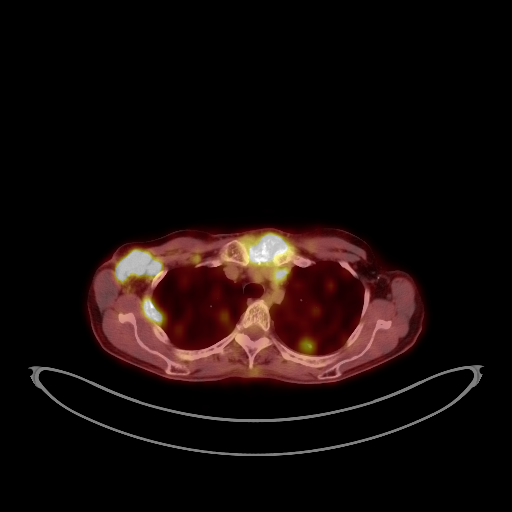
[im 98/196]
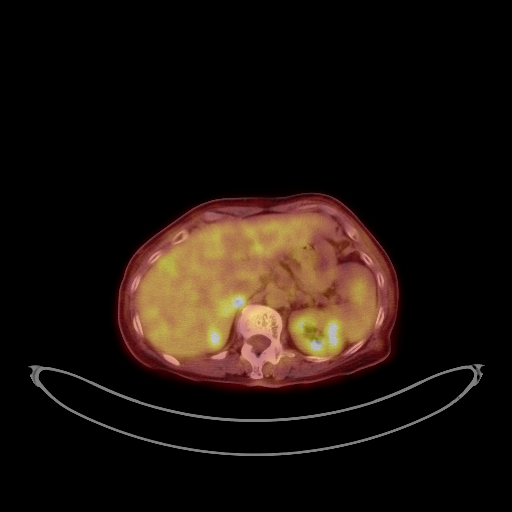
[im 147/196]
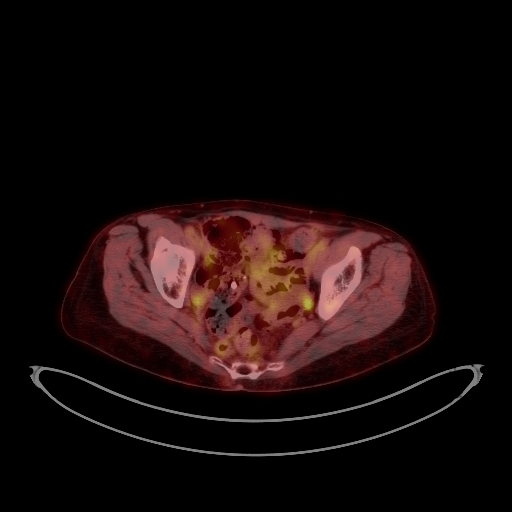
[im 196/196]
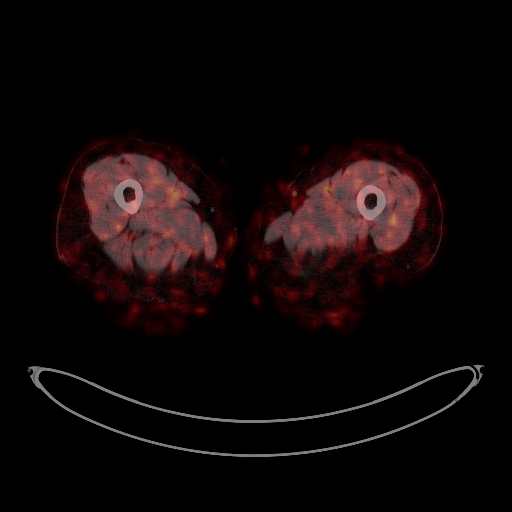

[Series 1083: results mm oncology reading · 5.0mm · 0.83mm/px · 1 of 9 slices shown]
[im 1/9]
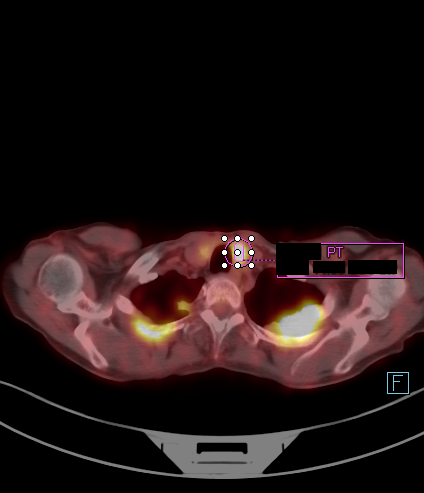

[25 of 25 positions shown; findings below may reference images not displayed]

FINDINGS: NECK

Newly hypermetabolic 1.4 cm lesion of the left thyroid lobe, maximum
SUV 14.6, compatible with malignancy.

CHEST

Unfortunately there has been severe progression of disease, with
scattered lung masses and lung nodules, tumor destroying the entire
sternum, and extensive hypermetabolic adenopathy involving the right
axillary, subpectoral, prevascular, internal mammary, pericardial,
and bilateral hilar nodal chains.

Right apical mass is destroying the right third rib posteriorly and
may have started as a rib lesion.

Index left apical mass measuring 5.8 by 3.1 cm on image [DATE] has a
maximum SUV of 15.6. An index peripheral right axillary node with
short axis diameter of 1.3 cm has a maximum standard uptake value of
18.0. The mass destroying the sternum, with subcutaneous and
mediastinal extension of tumor, has a maximum standard uptake value
of 17.9. There are at least 20 pulmonary nodules in each lung.

ABDOMEN/PELVIS

Scattered hepatic metastatic lesions are present. A lesion in
segment 8 measures approximately 2.1 cm in diameter on image 100/4
and has a maximum standard uptake value of 10.6. A lesion measuring
approximately 4.4 cm anteriorly in the spleen has a maximum standard
uptake value of 14.4.

SKELETON

Multifocal osseous metastatic disease observed. I discussed above
the lesion essentially totally destroying the sternum. Focal osseous
metastatic lesions are observed along the cervical spinous process,
within the right third rib, within the posterior elements of L2. The
L2 right pedicle and spinous process lesion has a maximum standard
uptake value of

There is also a subcutaneous lesion just to the left of midline
along the upper thoracic spine that is hypermetabolic and probably
represents a subcutaneous metastatic lesion, maximum standard uptake
value 9.4. This lesion is visible on image 50 of series 4 is a
slight thickening of the subcutaneous tissues with some increased
density.

Left mastectomy noted.
IMPRESSION: 1. Unfortunately there has been severe progression of metastatic
disease, with extensive metastatic disease to the chest and upper
abdomen, and several scattered bony lesions the as detailed above.
There has been marked destruction of almost the entire sternum and
at least 20 nodules/masses in each along, in addition to metastatic
lesions to the liver and spleen.
